# Patient Record
Sex: Female | Born: 1965 | Race: White | Hispanic: No | Marital: Married | Smoking: Never smoker
Health system: Southern US, Community
[De-identification: ages and names within clinical notes are randomized; demographics above are authoritative.]

## PROBLEM LIST (undated history)

## (undated) DIAGNOSIS — M722 Plantar fascial fibromatosis: Secondary | ICD-10-CM

## (undated) HISTORY — PX: FASCIOTOMY FOOT / TOE: SUR603

## (undated) HISTORY — DX: Plantar fascial fibromatosis: M72.2

---

## 2014-09-13 ENCOUNTER — Ambulatory Visit (INDEPENDENT_AMBULATORY_CARE_PROVIDER_SITE_OTHER): Payer: 59

## 2014-09-13 ENCOUNTER — Ambulatory Visit (INDEPENDENT_AMBULATORY_CARE_PROVIDER_SITE_OTHER): Payer: 59 | Admitting: Podiatry

## 2014-09-13 DIAGNOSIS — R52 Pain, unspecified: Secondary | ICD-10-CM

## 2014-09-13 DIAGNOSIS — M722 Plantar fascial fibromatosis: Secondary | ICD-10-CM

## 2014-09-13 NOTE — Progress Notes (Signed)
   Subjective:    Patient ID: Rachael BatonSharon H Fischer, female    DOB: 1965/11/08, 49 y.o.   MRN: [id number]  HPI  PT STATED LT FOOT ARCH, HEEL, AND BACK OF THE HEEL IS BEEN HURTING FOR 1 YEAR.  THE HEEL IS BEEN SAME BUT PAINFUL AND GET WORSE WHEN GET UP IN THE MORNING. DR. ?  TRIED 3 CORTISONE, ANKLE WARP, TAPING, AND INSERTS BUT NO HELP.  Review of Systems  All other systems reviewed and are negative.      Objective:   Physical Exam: I have reviewed her past medical history medications allergies surgery social history and review of systems. 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 Pulses are strongly palpable bilateral. Neurologic sensorium is intact per Semmes-Weinstein monofilament. Deep tendon reflexes are intact bilateral and muscle strength is 5 over 5 dorsiflexion plantar flexors inverters and everters all intrinsic musculature is intact. Orthopedic evaluation inserts all joints distal to the ankle for range of motion without crepitation. There she does have pain on direct palpation medial tibial tubercle of the left heel. Radiographs do demonstrate what appears to be a chronic inflammatory event at the plantar fascial calcaneal insertion site. He also has a relatively large retrocalcaneal heel spur with no increase in soft tissue density around the calcaneus and Ocean site of the Achilles.        Assessment & Plan:  Assessment: Chronic intractable plantar fasciitis of the left foot. We discussed the pros and cons of surgery we also discussed alternatives to surgery including extracorporal shock wave therapy physical therapy and sclerosing injections.  We went over a consent form today line by line number by number giving her ample time to ask questions she saw fit regarding an endoscopic plantar fasciotomy of the left foot. I answered all questions regarding this procedure to the best of my ability in layman's terms. We did discuss the possible postoperative  complications which may include but are not limited to postop pain bleeding swelling infection recurrence and need for further surgery. We also discussed the possible rupture of the plantar fascia as the lateral cuboid impingement syndrome. She understands this and is amenable to it signed Dr. pages of the consent form. She was dispensed a cam walker for postoperative ambulation and upon the next visit she will need to be dispensed a night splint as well. I will follow-up with her in the near future for surgery.

## 2014-10-18 ENCOUNTER — Other Ambulatory Visit: Payer: Self-pay | Admitting: Podiatry

## 2014-10-18 MED ORDER — PROMETHAZINE HCL 25 MG PO TABS: 25.0000 mg | ORAL_TABLET | Freq: Three times a day (TID) | ORAL | Status: DC | PRN

## 2014-10-18 MED ORDER — OXYCODONE-ACETAMINOPHEN 10-325 MG PO TABS: ORAL_TABLET | ORAL | Status: DC

## 2014-10-18 MED ORDER — CEPHALEXIN 500 MG PO CAPS: 500.0000 mg | ORAL_CAPSULE | Freq: Three times a day (TID) | ORAL | Status: DC

## 2014-10-20 ENCOUNTER — Encounter: Payer: Self-pay | Admitting: Podiatry

## 2014-10-20 DIAGNOSIS — M722 Plantar fascial fibromatosis: Secondary | ICD-10-CM | POA: Diagnosis not present

## 2014-10-25 ENCOUNTER — Ambulatory Visit (INDEPENDENT_AMBULATORY_CARE_PROVIDER_SITE_OTHER): Payer: 59 | Admitting: Podiatry

## 2014-10-25 VITALS — BP 148/90 | HR 111 | Temp 97.8°F | Resp 16

## 2014-10-25 DIAGNOSIS — Z9889 Other specified postprocedural states: Secondary | ICD-10-CM

## 2014-10-25 NOTE — Progress Notes (Signed)
She presents today 1 week status post EPF left foot. He states this seems to be doing well.  Objective: Vital signs are stable she is alert and oriented 3 dressing intact once removed demonstrates no erythema edema cellulitis drainage or odor sutures are intact and margins are well coapted.  Assessment: Well-healed surgical foot status post 1 week EPF left.  Plan: Redressed with light dressing today will allow her to start washing her foot and soaking in Epsom salts and warm water. She will continue the use of the Cam Walker at all times. She will follow up with Dr. Ardelle AntonWagoner in 1 week for suture removal. At that time she may go to a tennis shoe but she must sleep in the night splint or the Cam Walker for one month. I will follow-up with her 2 weeks after that.

## 2014-10-25 NOTE — Progress Notes (Signed)
DOS 10/20/2014 Endoscopic Plantar Fasciotomy left foot. 

## 2014-11-02 ENCOUNTER — Ambulatory Visit (INDEPENDENT_AMBULATORY_CARE_PROVIDER_SITE_OTHER): Payer: 59 | Admitting: Podiatry

## 2014-11-02 ENCOUNTER — Encounter: Payer: Self-pay | Admitting: Podiatry

## 2014-11-02 VITALS — BP 124/79 | HR 91 | Resp 16

## 2014-11-02 DIAGNOSIS — Z9889 Other specified postprocedural states: Secondary | ICD-10-CM | POA: Diagnosis not present

## 2014-11-02 DIAGNOSIS — M722 Plantar fascial fibromatosis: Secondary | ICD-10-CM

## 2014-11-02 NOTE — Patient Instructions (Signed)
Wear CAM walker or night splint at night.

## 2014-11-03 NOTE — Progress Notes (Signed)
Patient ID: Shon BatonSharon H Werber, female   DOB: 02/07/1966, 49 y.o.   MRN: [id number]  Subjective: 49 year old female returns the office 2 weeks status post left EPF. She states that she is doing well without any pain or any complaints. She's been soaking her feet in Epson salt soaks and continue with the Cam Walker. Denies any systemic complaints as fevers, chills, nausea, vomiting. Denies any calf pain, chest pain, shortness of breath.  Objective: AAO 3, NAD DP/PT pulses palpable, CRT less than 3 seconds Protective sensation appears to be intact Incision along the medial aspect of the left heel along the surgical site is well coapted without any evidence of dehiscence and sutures intact. There is no surrounding erythema, drainage, or any other clinical signs of infection. There is no tenderness to palpation around the surgical site is no tenderness overlying the plantar aspect of the calcaneus on the plantar fascia. There is no overlying edema, erythema, increase in warmth. No other areas of tenderness bilateral lower extremities. No pain with calf compression, swelling, warmth, erythema No other open lesions or pre-ulcerative lesions identified  Assessment: 49 year old female 2 weeks status post EPF left  Plan: -Sutures removed without complications. Interbody ointment and a Band-Aid was applied. -At this time she consented to transition back into a regular tennis shoe as tolerated however she must sleep the night splint or the Cam Walker. Night splint was dispensed. -Monitor for any signs or symptoms of infection and directed to call the office immediately should any occur. -Follow-up in 4 weeks or sooner if any problems are to arise. In the meantime encouraged to call the office with any questions, concerns, changes symptoms.

## 2014-11-14 ENCOUNTER — Ambulatory Visit (INDEPENDENT_AMBULATORY_CARE_PROVIDER_SITE_OTHER): Payer: 59 | Admitting: Podiatry

## 2014-11-14 ENCOUNTER — Encounter: Payer: Self-pay | Admitting: Podiatry

## 2014-11-14 VITALS — BP 150/95 | HR 87 | Resp 16

## 2014-11-14 DIAGNOSIS — Z9889 Other specified postprocedural states: Secondary | ICD-10-CM

## 2014-11-14 DIAGNOSIS — M722 Plantar fascial fibromatosis: Secondary | ICD-10-CM

## 2014-11-14 NOTE — Progress Notes (Signed)
She presents today 3 weeks status post endoscopic plantar fasciotomy for left heel. She states that the left heel is still sore not painful like it was a considerably sore. 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  Objective: Vital signs are stable she is alert and oriented 3. No erythema edema cellulitis drainage or odor. Nonpalpable medial fascial band. She does have palpable scar tissue from the surgical site which is tender on palpation.  Assessment: Resolving endoscopic plantar fasciotomy pain right foot.  Plan: Continue massage therapy and continue use of the tennis shoes with orthotics and the night splint at bedtime I will follow-up with her in 2-3 weeks.

## 2014-11-27 ENCOUNTER — Encounter: Payer: Self-pay | Admitting: Podiatry

## 2014-11-27 ENCOUNTER — Ambulatory Visit (INDEPENDENT_AMBULATORY_CARE_PROVIDER_SITE_OTHER): Payer: 59 | Admitting: Podiatry

## 2014-11-27 VITALS — BP 150/82 | HR 81 | Resp 16

## 2014-11-27 DIAGNOSIS — Z9889 Other specified postprocedural states: Secondary | ICD-10-CM

## 2014-11-27 DIAGNOSIS — M722 Plantar fascial fibromatosis: Secondary | ICD-10-CM

## 2014-11-27 NOTE — Progress Notes (Signed)
She presents today one month status post endoscopic plantar fasciotomy of her left foot. She states this seems to be getting better all the time. She continues to massage the scar tissue daily.  Objective: Vital signs are stable she's alert and oriented 3 minus less pain on palpation medial calcaneal tubercle of the left heel today. The hardware area of scar tissue appears to be much more supple at this point and appears to be healing and resolving.  Assessment: Well-healing endoscopic plantar fasciotomy left heel.  Plan: Follow up with me in 1 month continue all conservative therapies.

## 2014-11-29 ENCOUNTER — Encounter: Payer: 59 | Admitting: Podiatry

## 2014-12-25 ENCOUNTER — Ambulatory Visit (INDEPENDENT_AMBULATORY_CARE_PROVIDER_SITE_OTHER): Payer: 59 | Admitting: Podiatry

## 2014-12-25 ENCOUNTER — Encounter: Payer: Self-pay | Admitting: Podiatry

## 2014-12-25 VITALS — BP 143/81 | HR 80 | Resp 16

## 2014-12-25 DIAGNOSIS — Z9889 Other specified postprocedural states: Secondary | ICD-10-CM | POA: Diagnosis not present

## 2014-12-25 DIAGNOSIS — M722 Plantar fascial fibromatosis: Secondary | ICD-10-CM

## 2014-12-25 NOTE — Progress Notes (Signed)
She presents today for follow-up of her plantar fasciotomy. She states that she still has tenderness to her left heel but not like before. She states that this seems to be more postsurgical soreness than an actual fasciitis.  Objective: Vital signs are stable alert and oriented 3. Pulses are intact. She has pain on palpation with a palpable deep scar left heel.  Assessment: Status post endoscopic plantar fasciotomy with scar tissue formation left foot.  Plan: Injected today with Kenalog and local anesthetic. Encouraged activity and massage therapy.

## 2015-02-07 ENCOUNTER — Ambulatory Visit: Payer: 59 | Admitting: Podiatry

## 2015-04-04 ENCOUNTER — Ambulatory Visit (INDEPENDENT_AMBULATORY_CARE_PROVIDER_SITE_OTHER): Payer: 59 | Admitting: Podiatry

## 2015-04-04 ENCOUNTER — Ambulatory Visit (INDEPENDENT_AMBULATORY_CARE_PROVIDER_SITE_OTHER): Payer: 59

## 2015-04-04 ENCOUNTER — Encounter: Payer: Self-pay | Admitting: Podiatry

## 2015-04-04 VITALS — BP 139/83 | HR 85 | Resp 18

## 2015-04-04 DIAGNOSIS — R52 Pain, unspecified: Secondary | ICD-10-CM | POA: Diagnosis not present

## 2015-04-04 DIAGNOSIS — M722 Plantar fascial fibromatosis: Secondary | ICD-10-CM

## 2015-04-04 DIAGNOSIS — M7670 Peroneal tendinitis, unspecified leg: Secondary | ICD-10-CM

## 2015-04-04 DIAGNOSIS — M7752 Other enthesopathy of left foot: Secondary | ICD-10-CM | POA: Diagnosis not present

## 2015-04-04 DIAGNOSIS — M778 Other enthesopathies, not elsewhere classified: Secondary | ICD-10-CM

## 2015-04-04 DIAGNOSIS — M7672 Peroneal tendinitis, left leg: Secondary | ICD-10-CM

## 2015-04-04 DIAGNOSIS — M779 Enthesopathy, unspecified: Secondary | ICD-10-CM

## 2015-04-04 MED ORDER — METHYLPREDNISOLONE 4 MG PO TBPK: ORAL_TABLET | ORAL | Status: DC

## 2015-04-04 NOTE — Progress Notes (Signed)
She presents today many months status post EPF left performed March 2016. She states that she's tried to start running recently and has developed some mid arch pain and some pain to the lateral aspect of her foot. She denies any significant trauma. Each new shoes and wears orthotics that her were made by Dr. Orland Jarred 1 year ago.  Objective: Vital signs are stable alert and oriented 3 no acute distress. Pulses are strongly palpable neurologic sensorium is intact for Semmes-Weinstein monofilament. Deep tendon reflexes are intact bilateral and muscle strength is 5 over 5 dorsiflexion plantar flexors and inverters everters on physical musculatures intact. Orthopedic evaluation demonstrates mild tenderness on palpation medial longitudinal arch of the left foot as well as palpation of the peroneal longus tendon left. Radiographs taken in the office today demonstrate no major osseous abnormalities that she does demonstrate some soft tissue swelling along an os perineum of the left foot.  Assessment: Plantar fasciitis peroneal tendinitis left foot.  Plan: I encouraged her to continue to wear her orthotics with her tennis shoes and I suggested that she start icing the foot after exercise as well as started on a Medrol Dosepak.

## 2015-05-24 ENCOUNTER — Encounter: Payer: Self-pay | Admitting: Physician Assistant

## 2015-05-24 ENCOUNTER — Ambulatory Visit: Payer: Self-pay | Admitting: Family

## 2015-05-24 VITALS — BP 121/64 | Temp 98.6°F

## 2015-05-24 DIAGNOSIS — R05 Cough: Secondary | ICD-10-CM

## 2015-05-24 DIAGNOSIS — R059 Cough, unspecified: Secondary | ICD-10-CM

## 2015-05-24 DIAGNOSIS — J069 Acute upper respiratory infection, unspecified: Secondary | ICD-10-CM

## 2015-05-24 MED ORDER — AZITHROMYCIN 250 MG PO TABS: ORAL_TABLET | ORAL | Status: DC

## 2015-05-24 MED ORDER — HYDROCOD POLST-CPM POLST ER 10-8 MG/5ML PO SUER: 5.0000 mL | Freq: Two times a day (BID) | ORAL | Status: DC | PRN

## 2015-05-24 NOTE — Progress Notes (Signed)
S. Cough , cold sxs x one week starting to get facial pain and pressure, malaise. Cough worse at night and can not sleep  O/ alert pleasant NAD , VSS  ENT + turbinates boggy inflamed, + facial guarding, pharynx injected  Neck supple heart rsr lungs clear A/ URI  Cough  P Zpack 250 . rx  Written for Tussionex  1 tsp q12 h prn #100 cc orf  supportive measures.

## 2015-10-10 ENCOUNTER — Ambulatory Visit (INDEPENDENT_AMBULATORY_CARE_PROVIDER_SITE_OTHER): Payer: 59 | Admitting: Podiatry

## 2015-10-10 ENCOUNTER — Ambulatory Visit: Payer: Self-pay

## 2015-10-10 ENCOUNTER — Encounter: Payer: Self-pay | Admitting: Podiatry

## 2015-10-10 ENCOUNTER — Ambulatory Visit (INDEPENDENT_AMBULATORY_CARE_PROVIDER_SITE_OTHER): Payer: 59

## 2015-10-10 VITALS — BP 125/88 | HR 111 | Resp 16

## 2015-10-10 DIAGNOSIS — M722 Plantar fascial fibromatosis: Secondary | ICD-10-CM | POA: Diagnosis not present

## 2015-10-10 DIAGNOSIS — M7672 Peroneal tendinitis, left leg: Secondary | ICD-10-CM

## 2015-10-10 DIAGNOSIS — M7670 Peroneal tendinitis, unspecified leg: Secondary | ICD-10-CM

## 2015-10-10 MED ORDER — METHYLPREDNISOLONE 4 MG PO TBPK: ORAL_TABLET | ORAL | Status: DC

## 2015-10-10 NOTE — Patient Instructions (Addendum)
Plantar Fasciitis (Heel Spur Syndrome) with Rehab The plantar fascia is a fibrous, ligament-like, soft-tissue structure that spans the bottom of the foot. Plantar fasciitis is a condition that causes pain in the foot due to inflammation of the tissue. SYMPTOMS   Pain and tenderness on the underneath side of the foot.  Pain that worsens with standing or walking. CAUSES  Plantar fasciitis is caused by irritation and injury to the plantar fascia on the underneath side of the foot. Common mechanisms of injury include:  Direct trauma to bottom of the foot.  Damage to a small nerve that runs under the foot where the main fascia attaches to the heel bone.  Stress placed on the plantar fascia due to bone spurs. RISK INCREASES WITH:   Activities that place stress on the plantar fascia (running, jumping, pivoting, or cutting).  Poor strength and flexibility.  Improperly fitted shoes.  Tight calf muscles.  Flat feet.  Failure to warm-up properly before activity.  Obesity. PREVENTION  Warm up and stretch properly before activity.  Allow for adequate recovery between workouts.  Maintain physical fitness:  Strength, flexibility, and endurance.  Cardiovascular fitness.  Maintain a health body weight.  Avoid stress on the plantar fascia.  Wear properly fitted shoes, including arch supports for individuals who have flat feet.  PROGNOSIS  If treated properly, then the symptoms of plantar fasciitis usually resolve without surgery. However, occasionally surgery is necessary.  RELATED COMPLICATIONS   Recurrent symptoms that may result in a chronic condition.  Problems of the lower back that are caused by compensating for the injury, such as limping.  Pain or weakness of the foot during push-off following surgery.  Chronic inflammation, scarring, and partial or complete fascia tear, occurring more often from repeated injections.  TREATMENT  Treatment initially involves the  use of ice and medication to help reduce pain and inflammation. The use of strengthening and stretching exercises may help reduce pain with activity, especially stretches of the Achilles tendon. These exercises may be performed at home or with a therapist. Your caregiver may recommend that you use heel cups of arch supports to help reduce stress on the plantar fascia. Occasionally, corticosteroid injections are given to reduce inflammation. If symptoms persist for greater than 6 months despite non-surgical (conservative), then surgery may be recommended.   MEDICATION   If pain medication is necessary, then nonsteroidal anti-inflammatory medications, such as aspirin and ibuprofen, or other minor pain relievers, such as acetaminophen, are often recommended.  Do not take pain medication within 7 days before surgery.  Prescription pain relievers may be given if deemed necessary by your caregiver. Use only as directed and only as much as you need.  Corticosteroid injections may be given by your caregiver. These injections should be reserved for the most serious cases, because they may only be given a certain number of times.  HEAT AND COLD  Cold treatment (icing) relieves pain and reduces inflammation. Cold treatment should be applied for 10 to 15 minutes every 2 to 3 hours for inflammation and pain and immediately after any activity that aggravates your symptoms. Use ice packs or massage the area with a piece of ice (ice massage).  Heat treatment may be used prior to performing the stretching and strengthening activities prescribed by your caregiver, physical therapist, or athletic trainer. Use a heat pack or soak the injury in warm water.  SEEK IMMEDIATE MEDICAL CARE IF:  Treatment seems to offer no benefit, or the condition worsens.  Any medications   produce adverse side effects.  EXERCISES- RANGE OF MOTION (ROM) AND STRETCHING EXERCISES - Plantar Fasciitis (Heel Spur Syndrome) These exercises  may help you when beginning to rehabilitate your injury. Your symptoms may resolve with or without further involvement from your physician, physical therapist or athletic trainer. While completing these exercises, remember:   Restoring tissue flexibility helps normal motion to return to the joints. This allows healthier, less painful movement and activity.  An effective stretch should be held for at least 30 seconds.  A stretch should never be painful. You should only feel a gentle lengthening or release in the stretched tissue.  RANGE OF MOTION - Toe Extension, Flexion  Sit with your right / left leg crossed over your opposite knee.  Grasp your toes and gently pull them back toward the top of your foot. You should feel a stretch on the bottom of your toes and/or foot.  Hold this stretch for 10 seconds.  Now, gently pull your toes toward the bottom of your foot. You should feel a stretch on the top of your toes and or foot.  Hold this stretch for 10 seconds. Repeat  times. Complete this stretch 3 times per day.   RANGE OF MOTION - Ankle Dorsiflexion, Active Assisted  Remove shoes and sit on a chair that is preferably not on a carpeted surface.  Place right / left foot under knee. Extend your opposite leg for support.  Keeping your heel down, slide your right / left foot back toward the chair until you feel a stretch at your ankle or calf. If you do not feel a stretch, slide your bottom forward to the edge of the chair, while still keeping your heel down.  Hold this stretch for 10 seconds. Repeat 3 times. Complete this stretch 2 times per day.   STRETCH  Gastroc, Standing  Place hands on wall.  Extend right / left leg, keeping the front knee somewhat bent.  Slightly point your toes inward on your back foot.  Keeping your right / left heel on the floor and your knee straight, shift your weight toward the wall, not allowing your back to arch.  You should feel a gentle stretch  in the right / left calf. Hold this position for 10 seconds. Repeat 3 times. Complete this stretch 2 times per day.  STRETCH  Soleus, Standing  Place hands on wall.  Extend right / left leg, keeping the other knee somewhat bent.  Slightly point your toes inward on your back foot.  Keep your right / left heel on the floor, bend your back knee, and slightly shift your weight over the back leg so that you feel a gentle stretch deep in your back calf.  Hold this position for 10 seconds. Repeat 3 times. Complete this stretch 2 times per day.  STRETCH  Gastrocsoleus, Standing  Note: This exercise can place a lot of stress on your foot and ankle. Please complete this exercise only if specifically instructed by your caregiver.   Place the ball of your right / left foot on a step, keeping your other foot firmly on the same step.  Hold on to the wall or a rail for balance.  Slowly lift your other foot, allowing your body weight to press your heel down over the edge of the step.  You should feel a stretch in your right / left calf.  Hold this position for 10 seconds.  Repeat this exercise with a slight bend in your right /   left knee. Repeat 3 times. Complete this stretch 2 times per day.   STRENGTHENING EXERCISES - Plantar Fasciitis (Heel Spur Syndrome)  These exercises may help you when beginning to rehabilitate your injury. They may resolve your symptoms with or without further involvement from your physician, physical therapist or athletic trainer. While completing these exercises, remember:   Muscles can gain both the endurance and the strength needed for everyday activities through controlled exercises.  Complete these exercises as instructed by your physician, physical therapist or athletic trainer. Progress the resistance and repetitions only as guided.  STRENGTH - Towel Curls  Sit in a chair positioned on a non-carpeted surface.  Place your foot on a towel, keeping your heel  on the floor.  Pull the towel toward your heel by only curling your toes. Keep your heel on the floor. Repeat 3 times. Complete this exercise 2 times per day.  STRENGTH - Ankle Inversion  Secure one end of a rubber exercise band/tubing to a fixed object (table, pole). Loop the other end around your foot just before your toes.  Place your fists between your knees. This will focus your strengthening at your ankle.  Slowly, pull your big toe up and in, making sure the band/tubing is positioned to resist the entire motion.  Hold this position for 10 seconds.  Have your muscles resist the band/tubing as it slowly pulls your foot back to the starting position. Repeat 3 times. Complete this exercises 2 times per day.  Document Released: 07/21/2005 Document Revised: 10/13/2011 Document Reviewed: 11/02/2008 [facility] IncExitCare Patient Information 2014 [place], [place]. Peroneal Tendinitis With Rehab Tendonitis is inflammation of a tendon. Inflammation of the tendons on the back of the outer ankle (peroneal tendons) is known as peroneal tendonitis. The peroneal tendons are responsible for connecting the muscles that allow you to stand on your tiptoes to the bones of the ankle. For this reason, peroneal tendonitis often causes pain when trying to complete such motions. Peroneal tendonitis often involves a tear (strain) of the peroneal tendons. Strains are classified into three categories. Grade 1 strains cause pain, but the tendon is not lengthened. Grade 2 strains include a lengthened ligament, due to the ligament being stretched or partially ruptured. With grade 2 strains there is still function, although function may be decreased. Grade 3 strains involve a complete tear of the tendon or muscle, and function is usually impaired. SYMPTOMS   Pain, tenderness, swelling, warmth, or redness over the back of the outer side of the ankle, the outer part of the mid-foot, or the bottom of the arch.  Pain that gets worse  with ankle motion (especially when pushing off or pushing down with the front of the foot), or when standing on the ball of the foot or pushing the foot outward.  Crackling sound (crepitation) when the tendon is moved or touched. CAUSES  Peroneal tendinitis occurs when injury to the peroneal tendons causes the body to respond with inflammation. Common causes of injury include:  An overuse injury, in which the groove behind the outer ankle (where the tendon is located) causes wear on the tendon.  A sudden stress placed on the tendon, such as from an increase in the intensity, frequency, or duration of training.  Direct hit (trauma) to the tendon.  Return to activity too soon after a previous ankle injury. RISK INCREASES WITH:  Sports that require sudden, repetitive pushing off of the foot, such as jumping or quick starts.  Kicking and running sports, especially running down  hills or long distances.  Poor strength and flexibility.  Previous injury to the foot, ankle, or leg. PREVENTION  Warm up and stretch properly before activity.  Allow for adequate recovery between workouts.  Maintain physical fitness:  Strength, flexibility, and endurance.  Cardiovascular fitness.  Complete rehabilitation after previous injury. PROGNOSIS  If treated properly, peroneal tendonitis usually heals within 6 weeks.  RELATED COMPLICATIONS  Longer healing time, if not properly treated or if not given enough time to heal.  Recurring symptoms if activity is resumed too soon, with overuse, or when using poor technique.  If untreated, tendinitis may result in tendon rupture, requiring surgery. TREATMENT  Treatment first involves the use of ice and medicine to reduce pain and inflammation. The use of strengthening and stretching exercises may help reduce pain with activity. These exercises may be performed at unsuccessful, surgery to remove the inflamed tendon lining (sheath) may be advised.    MEDICATION   If pain medicine is needed, nonsteroidal anti-inflammatory medicines (aspirin and ibuprofen), or other minor pain relievers (acetaminophen), are often advised.  Do not take pain medicine for 7 days before surgery.  Prescription pain relievers may be given, if your caregiver thinks they are needed. Use only as directed and only as much as you need. HEAT AND COLD  Cold treatment (icing) should be applied for 10 to 15 minutes every 2 to 3 hours for inflammation and pain, and immediately after activity that aggravates your symptoms. Use ice packs or an ice massage.  Heat treatment may be used before performing stretching and strengthening activities prescribed by your caregiver, physical therapist, or athletic trainer. Use a heat pack or a warm water soak. SEEK MEDICAL CARE IF:  Symptoms get worse or do not improve in 2 to 4 weeks, despite treatment.  New, unexplained symptoms develop. (Drugs used in treatment may produce side effects.) EXERCISES RANGE OF MOTION (ROM) AND STRETCHING EXERCISES - Peroneal Tendinitis These exercises may help you when beginning to rehabilitate your injury. Your symptoms may resolve with or without further involvement from your physician, physical therapist or athletic trainer. While completing these exercises, remember:   Restoring tissue flexibility helps normal motion to return to the joints. This allows healthier, less painful movement and activity.  An effective stretch should be held for at least 30 seconds.  A stretch should never be painful. You should only feel a gentle lengthening or release in the stretched tissue. RANGE OF MOTION - Ankle Eversion  Sit with your right / left ankle crossed over your opposite knee.  Grip your foot with your opposite hand, placing your thumb on the top of your foot and your fingers across the bottom of your foot.  Gently push your foot downward with a slight rotation, so your littlest toes rise slightly  toward the ceiling.  You should feel a gentle stretch on the inside of your ankle. Hold the stretch for __________ seconds. Repeat __________ times. Complete this exercise __________ times per day.  RANGE OF MOTION - Ankle Inversion  Sit with your right / left ankle crossed over your opposite knee.  Grip your foot with your opposite hand, placing your thumb on the bottom of your foot and your fingers across the top of your foot.  Gently pull your foot so the smallest toe comes toward you and your thumb pushes the inside of the ball of your foot away from you.  You should feel a gentle stretch on the outside of your ankle. Hold the stretch  for __________ seconds. Repeat __________ times. Complete this exercise __________ times per day.  RANGE OF MOTION - Ankle Plantar Flexion  Sit with your right / left leg crossed over your opposite knee.  Use your opposite hand to pull the top of your foot and toes toward you.  You should feel a gentle stretch on the top of your foot and ankle. Hold this position for __________ seconds. Repeat __________ times. Complete __________ times per day.  STRETCH - Gastroc, Standing  Place your hands on a wall.  Extend your right / left leg behind you, keeping the front knee somewhat bent.  Slightly point your toes inward on your back foot.  Keeping your right / left heel on the floor and your knee straight, shift your weight toward the wall, not allowing your back to arch.  You should feel a gentle stretch in the calf. Hold this position for __________ seconds. Repeat __________ times. Complete this stretch __________ times per day. STRETCH - Soleus, Standing  Place your hands on a wall.  Extend your right / left leg behind you, keeping the other knee somewhat bent.  Slightly point your toes inward on your back foot.  Keep your heel on the floor, bend your back knee, and slightly shift your weight over the back leg so that you feel a gentle stretch  deep in your back calf.  Hold this position for __________ seconds. Repeat __________ times. Complete this stretch __________ times per day. STRETCH - Gastrocsoleus, Standing Note: This exercise can place a lot of stress on your foot and ankle. Please complete this exercise only if specifically instructed by your caregiver.   Place the ball of your right / left foot on a step, keeping your other foot firmly on the same step.  Hold on to the wall or a rail for balance.  Slowly lift your other foot, allowing your body weight to press your heel down over the edge of the step.  You should feel a stretch in your right / left calf.  Hold this position for __________ seconds.  Repeat this exercise with a slight bend in your knee. Repeat __________ times. Complete this stretch __________ times per day.  STRENGTHENING EXERCISES - Peroneal Tendinitis  These exercises may help you when beginning to rehabilitate your injury. They may resolve your symptoms with or without further involvement from your physician, physical therapist or athletic trainer. While completing these exercises, remember:   Muscles can gain both the endurance and the strength needed for everyday activities through controlled exercises.  Complete these exercises as instructed by your physician, physical therapist or athletic trainer. Increase the resistance and repetitions only as guided by your caregiver. STRENGTH - Dorsiflexors  Secure a rubber exercise band or tubing to a fixed object (table, pole) and loop the other end around your right / left foot.  Sit on the floor facing the fixed object. The band should be slightly tense when your foot is relaxed.  Slowly draw your foot back toward you, using your ankle and toes.  Hold this position for __________ seconds. Slowly release the tension in the band and return your foot to the starting position. Repeat __________ times. Complete this exercise __________ times per day.    STRENGTH - Towel Curls  Sit in a chair, on a non-carpeted surface.  Place your foot on a towel, keeping your heel on the floor.  Pull the towel toward your heel only by curling your toes. Keep your heel on  the floor.  If instructed by your physician, physical therapist or athletic trainer, add weight to the end of the towel. Repeat __________ times. Complete this exercise __________ times per day. STRENGTH - Ankle Eversion   Secure one end of a rubber exercise band or tubing to a fixed object (table, pole). Loop the other end around your foot, just before your toes.  Place your fists between your knees. This will focus your strengthening at your ankle.  Drawing the band across your opposite foot, away from the pole, slowly, pull your little toe out and up. Make sure the band is positioned to resist the entire motion.  Hold this position for __________ seconds.  Have your muscles resist the band, as it slowly pulls your foot back to the starting position. Repeat __________ times. Complete this exercise __________ times per day.    This information is not intended to replace advice given to you by your health care provider. Make sure you discuss any questions you have with your health care provider.   Document Released: 07/21/2005 Document Revised: 12/05/2014 Document Reviewed: 11/02/2008 [employer] Interactive Patient Education [employer]

## 2015-10-10 NOTE — Progress Notes (Signed)
She presents today with chief complaint of a new plantar fascial pain to the right foot. She still has some pain to the dorsolateral aspect of her left foot.  Objective: Vital signs are stable she is alert and oriented 3. Pulses are palpable. She is pain on palpation medial tubercle of the right heel today radiographs taken today do demonstrate soft tissue increase in density plantar fascial tendon insertion site of the right foot. Left foot demonstrates no major osseous abnormalities.  Assessment: Plantar fascitis right. Compensatory tendinitis. No tendinitis left foot.  Plan: I injected between the fourth and fifth metatarsals of the left foot today and I also injected the right heel today both with Kenalog and local anesthetic fascial brace applied to the right foot. Discussed appropriate shoes to excise and ice therapy. Started her on Medrol Dosepak. Follow up with her in 1 month

## 2015-11-21 ENCOUNTER — Ambulatory Visit: Payer: 59 | Admitting: Podiatry

## 2016-02-25 ENCOUNTER — Ambulatory Visit: Payer: 59 | Admitting: Podiatry

## 2016-03-06 ENCOUNTER — Encounter: Payer: Self-pay | Admitting: Podiatry

## 2016-03-06 ENCOUNTER — Ambulatory Visit (INDEPENDENT_AMBULATORY_CARE_PROVIDER_SITE_OTHER): Payer: 59 | Admitting: Podiatry

## 2016-03-06 DIAGNOSIS — M7670 Peroneal tendinitis, unspecified leg: Secondary | ICD-10-CM | POA: Diagnosis not present

## 2016-03-06 DIAGNOSIS — M722 Plantar fascial fibromatosis: Secondary | ICD-10-CM | POA: Diagnosis not present

## 2016-03-06 DIAGNOSIS — M7672 Peroneal tendinitis, left leg: Secondary | ICD-10-CM

## 2016-03-07 NOTE — Progress Notes (Signed)
She presents today after having not seen her since March for follow-up of her plantar fasciitis of her right foot and peroneal tendinitis of the left foot. She states of the left foot is doing pretty well with the right foot is exquisitely painful.  Objective: Vital signs are stable she's alert and oriented 3. Pulses are palpable. She has no calf pain. She is severe pain on palpation medial cartilage over the right heel. She also has pain along the lateral aspect of the left foot. Pain on palpation to the lateral condyle and the fifth metatarsal base.  Assessment: Fasciitis right heel. Peroneal tendinitis and lateral plantar fasciitis left. The left foot has been surgically corrected.  Plan: I reinjected the area today and we discussed the possible need for surgical intervention. She states she would like to do this as soon as possible. I will follow-up with her in the near future for surgical consult regarding her right heel.

## 2016-03-27 ENCOUNTER — Ambulatory Visit: Payer: 59 | Admitting: Podiatry

## 2016-05-09 ENCOUNTER — Ambulatory Visit: Payer: Self-pay | Admitting: Physician Assistant

## 2016-05-09 ENCOUNTER — Encounter: Payer: Self-pay | Admitting: Physician Assistant

## 2016-05-09 VITALS — BP 159/80 | HR 100 | Temp 98.5°F

## 2016-05-09 DIAGNOSIS — J309 Allergic rhinitis, unspecified: Principal | ICD-10-CM

## 2016-05-09 DIAGNOSIS — H1010 Acute atopic conjunctivitis, unspecified eye: Secondary | ICD-10-CM

## 2016-05-09 MED ORDER — TOBRAMYCIN 0.3 % OP SOLN: 2.0000 [drp] | OPHTHALMIC | 0 refills | Status: DC

## 2016-05-09 MED ORDER — OLOPATADINE HCL 0.1 % OP SOLN: 1.0000 [drp] | Freq: Two times a day (BID) | OPHTHALMIC | 12 refills | Status: DC

## 2016-05-09 NOTE — Progress Notes (Signed)
S: c/o runny nose, congestion, itchy eyes, some sinus pressure, sx for about a week, denies fever/chills/body aches, cough, cp/sob, or v/d. Mucus is clear, using neti pot, worried that she has pink eye or scratched her eye with contact lens  O: vitals wnl, nad, perrl eomi, conjunctiva wnl, no abrasion noted, tms dull, nasal mucosa swollen and boggy, throat wnl, neck supple no lymph, lungs c t a, cv rrr  A: acute seasonal allergies, allergic conjunctivitis  P: saline nasal rinse, patanol opth gtts, tobramycin if redness worsening over weekend

## 2016-07-03 DIAGNOSIS — H5213 Myopia, bilateral: Secondary | ICD-10-CM | POA: Diagnosis not present

## 2016-08-20 ENCOUNTER — Ambulatory Visit: Payer: 59 | Admitting: Podiatry

## 2016-09-01 ENCOUNTER — Ambulatory Visit (INDEPENDENT_AMBULATORY_CARE_PROVIDER_SITE_OTHER): Payer: 59 | Admitting: Podiatry

## 2016-09-01 DIAGNOSIS — M722 Plantar fascial fibromatosis: Secondary | ICD-10-CM | POA: Diagnosis not present

## 2016-09-01 MED ORDER — ETODOLAC ER 400 MG PO TB24: 400.0000 mg | ORAL_TABLET | Freq: Every day | ORAL | 3 refills | Status: DC

## 2016-09-01 NOTE — Progress Notes (Signed)
She presents today for follow-up of her plantar fasciitis bilateral states that the heels been doing that for 6 months and over the past month or so started to ache a little bit. She refers to the right foot and to a lesser degree left foot.  Objective: Vital signs are stable she is alert and oriented 3. Pulses are palpable. She is pain on palpation medially continue tubercles bilateral right greater than left.  Assessment: Chronic intractable plantar fasciitis right greater than left. History syndrome left.  Plan: I injected the bilateral heels today with prolonged local anesthetic will follow up with her on an as-needed basis.

## 2016-09-04 ENCOUNTER — Encounter: Payer: Self-pay | Admitting: Family Medicine

## 2016-09-04 ENCOUNTER — Ambulatory Visit (INDEPENDENT_AMBULATORY_CARE_PROVIDER_SITE_OTHER): Payer: 59 | Admitting: Family Medicine

## 2016-09-04 VITALS — BP 134/84 | HR 84 | Temp 98.4°F | Ht 64.5 in | Wt 153.6 lb

## 2016-09-04 DIAGNOSIS — Z0000 Encounter for general adult medical examination without abnormal findings: Secondary | ICD-10-CM | POA: Insufficient documentation

## 2016-09-04 DIAGNOSIS — Z1231 Encounter for screening mammogram for malignant neoplasm of breast: Secondary | ICD-10-CM | POA: Diagnosis not present

## 2016-09-04 DIAGNOSIS — Z1239 Encounter for other screening for malignant neoplasm of breast: Secondary | ICD-10-CM

## 2016-09-04 DIAGNOSIS — Z1211 Encounter for screening for malignant neoplasm of colon: Secondary | ICD-10-CM

## 2016-09-04 DIAGNOSIS — Z719 Counseling, unspecified: Secondary | ICD-10-CM | POA: Insufficient documentation

## 2016-09-04 LAB — LIPID PANEL
Cholesterol: 187 mg/dL (ref 0–200)
HDL: 60.3 mg/dL
LDL Cholesterol: 115 mg/dL — ABNORMAL HIGH (ref 0–99)
NonHDL: 126.54
Total CHOL/HDL Ratio: 3
Triglycerides: 58 mg/dL (ref 0.0–149.0)
VLDL: 11.6 mg/dL (ref 0.0–40.0)

## 2016-09-04 LAB — CBC
HCT: 42.2 % (ref 36.0–46.0)
HEMOGLOBIN: 14.3 g/dL (ref 12.0–15.0)
MCHC: 34 g/dL (ref 30.0–36.0)
MCV: 90 fl (ref 78.0–100.0)
PLATELETS: 335 10*3/uL (ref 150.0–400.0)
RBC: 4.69 Mil/uL (ref 3.87–5.11)
RDW: 12.7 % (ref 11.5–15.5)
WBC: 8.3 10*3/uL (ref 4.0–10.5)

## 2016-09-04 LAB — COMPREHENSIVE METABOLIC PANEL WITH GFR
ALT: 17 U/L (ref 0–35)
AST: 22 U/L (ref 0–37)
Albumin: 4.9 g/dL (ref 3.5–5.2)
Alkaline Phosphatase: 87 U/L (ref 39–117)
BUN: 16 mg/dL (ref 6–23)
CO2: 32 meq/L (ref 19–32)
Calcium: 10.1 mg/dL (ref 8.4–10.5)
Chloride: 102 meq/L (ref 96–112)
Creatinine, Ser: 0.74 mg/dL (ref 0.40–1.20)
GFR: 87.42 mL/min
Glucose, Bld: 97 mg/dL (ref 70–99)
Potassium: 4 meq/L (ref 3.5–5.1)
Sodium: 139 meq/L (ref 135–145)
Total Bilirubin: 1 mg/dL (ref 0.2–1.2)
Total Protein: 8.2 g/dL (ref 6.0–8.3)

## 2016-09-04 LAB — HEMOGLOBIN A1C: Hgb A1c MFr Bld: 5.5 % (ref 4.6–6.5)

## 2016-09-04 NOTE — Assessment & Plan Note (Signed)
Patient going to GYN for Pap smear. Mammogram ordered. Patient to schedule. Referral to GI place for colonoscopy. Immunizations up-to-date. Labs today. BP slightly elevated today. 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 BP is persistently greater than 130/80, we will need to discuss treatment options.

## 2016-09-04 NOTE — Patient Instructions (Signed)
Keep an eye on your pressure.  If it is persistently greater than 130/80 let me know.  We will schedule your colonoscopy.  GYN recommendation - [facility] - Dr. Lynnette Caffey.  We will call with your lab results.  Take care  Follow up annually  Dr. Lacinda Axon  Health Maintenance, Female Introduction Adopting a healthy lifestyle and getting preventive care can go a long way to promote health and wellness. Talk with your health care provider about what schedule of regular examinations is right for you. This is a good chance for you to check in with your provider about disease prevention and staying healthy. In between checkups, there are plenty of things you can do on your own. Experts have done a lot of research about which lifestyle changes and preventive measures are most likely to keep you healthy. Ask your health care provider for more information. Weight and diet Eat a healthy diet  Be sure to include plenty of vegetables, fruits, low-fat dairy products, and lean protein.  Do not eat a lot of foods high in solid fats, added sugars, or salt.  Get regular exercise. This is one of the most important things you can do for your health.  Most adults should exercise for at least 150 minutes each week. The exercise should increase your heart rate and make you sweat (moderate-intensity exercise).  Most adults should also do strengthening exercises at least twice a week. This is in addition to the moderate-intensity exercise. Maintain a healthy weight  Body mass index (BMI) is a measurement that can be used to identify possible weight problems. It estimates body fat based on height and weight. Your health care provider can help determine your BMI and help you achieve or maintain a healthy weight.  For females 59 years of age and older:  A BMI below 18.5 is considered underweight.  A BMI of 18.5 to 24.9 is normal.  A BMI of 25 to 29.9 is considered overweight.  A BMI of 30 and above  is considered obese. Watch levels of cholesterol and blood lipids  You should start having your blood tested for lipids and cholesterol at 53 years of age, then have this test every 5 years.  You may need to have your cholesterol levels checked more often if:  Your lipid or cholesterol levels are high.  You are older than 53 years of age.  You are at high risk for heart disease. Cancer screening Lung Cancer  Lung cancer screening is recommended for adults 41-38 years old who are at high risk for lung cancer because of a history of smoking.  A yearly low-dose CT scan of the lungs is recommended for people who:  Currently smoke.  Have quit within the past 15 years.  Have at least a 30-pack-year history of smoking. A pack year is smoking an average of one pack of cigarettes a day for 1 year.  Yearly screening should continue until it has been 15 years since you quit.  Yearly screening should stop if you develop a health problem that would prevent you from having lung cancer treatment. Breast Cancer  Practice breast self-awareness. This means understanding how your breasts normally appear and feel.  It also means doing regular breast self-exams. Let your health care provider know about any changes, no matter how small.  If you are in your 20s or 30s, you should have a clinical breast exam (CBE) by a health care provider every 1-3 years as part of a regular  health exam.  If you are 40 or older, have a CBE every year. Also consider having a breast X-ray (mammogram) every year.  If you have a family history of breast cancer, talk to your health care provider about genetic screening.  If you are at high risk for breast cancer, talk to your health care provider about having an MRI and a mammogram every year.  Breast cancer gene (BRCA) assessment is recommended for women who have family members with BRCA-related cancers. BRCA-related cancers  include:  Breast.  Ovarian.  Tubal.  Peritoneal cancers.  Results of the assessment will determine the need for genetic counseling and BRCA1 and BRCA2 testing. Cervical Cancer  Your health care provider may recommend that you be screened regularly for cancer of the pelvic organs (ovaries, uterus, and vagina). This screening involves a pelvic examination, including checking for microscopic changes to the surface of your cervix (Pap test). You may be encouraged to have this screening done every 3 years, beginning at age 21.  For women ages 30-65, health care providers may recommend pelvic exams and Pap testing every 3 years, or they may recommend the Pap and pelvic exam, combined with testing for human papilloma virus (HPV), every 5 years. Some types of HPV increase your risk of cervical cancer. Testing for HPV may also be done on women of any age with unclear Pap test results.  Other health care providers may not recommend any screening for nonpregnant women who are considered low risk for pelvic cancer and who do not have symptoms. Ask your health care provider if a screening pelvic exam is right for you.  If you have had past treatment for cervical cancer or a condition that could lead to cancer, you need Pap tests and screening for cancer for at least 20 years after your treatment. If Pap tests have been discontinued, your risk factors (such as having a new sexual partner) need to be reassessed to determine if screening should resume. Some women have medical problems that increase the chance of getting cervical cancer. In these cases, your health care provider may recommend more frequent screening and Pap tests. Colorectal Cancer  This type of cancer can be detected and often prevented.  Routine colorectal cancer screening usually begins at 53 years of age and continues through 53 years of age.  Your health care provider may recommend screening at an earlier age if you have risk factors  for colon cancer.  Your health care provider may also recommend using home test kits to check for hidden blood in the stool.  A small camera at the end of a tube can be used to examine your colon directly (sigmoidoscopy or colonoscopy). This is done to check for the earliest forms of colorectal cancer.  Routine screening usually begins at age 50.  Direct examination of the colon should be repeated every 5-10 years through 53 years of age. However, you may need to be screened more often if early forms of precancerous polyps or small growths are found. Skin Cancer  Check your skin from head to toe regularly.  Tell your health care provider about any new moles or changes in moles, especially if there is a change in a mole's shape or color.  Also tell your health care provider if you have a mole that is larger than the size of a pencil eraser.  Always use sunscreen. Apply sunscreen liberally and repeatedly throughout the day.  Protect yourself by wearing long sleeves, pants, a wide-brimmed hat,   and sunglasses whenever you are outside. Heart disease, diabetes, and high blood pressure  High blood pressure causes heart disease and increases the risk of stroke. High blood pressure is more likely to develop in:  People who have blood pressure in the high end of the normal range (130-139/85-89 mm Hg).  People who are overweight or obese.  People who are African American.  If you are 79-48 years of age, have your blood pressure checked every 3-5 years. If you are 64 years of age or older, have your blood pressure checked every year. You should have your blood pressure measured twice-once when you are at a hospital or clinic, and once when you are not at a hospital or clinic. Record the average of the two measurements. To check your blood pressure when you are not at a hospital or clinic, you can use:  An automated blood pressure machine at a pharmacy.  A home blood pressure monitor.  If you  are between 66 years and 27 years old, ask your health care provider if you should take aspirin to prevent strokes.  Have regular diabetes screenings. This involves taking a blood sample to check your fasting blood sugar level.  If you are at a normal weight and have a low risk for diabetes, have this test once every three years after 53 years of age.  If you are overweight and have a high risk for diabetes, consider being tested at a younger age or more often. Preventing infection Hepatitis B  If you have a higher risk for hepatitis B, you should be screened for this virus. You are considered at high risk for hepatitis B if:  You were born in a country where hepatitis B is common. Ask your health care provider which countries are considered high risk.  Your parents were born in a high-risk country, and you have not been immunized against hepatitis B (hepatitis B vaccine).  You have HIV or AIDS.  You use needles to inject street drugs.  You live with someone who has hepatitis B.  You have had sex with someone who has hepatitis B.  You get hemodialysis treatment.  You take certain medicines for conditions, including cancer, organ transplantation, and autoimmune conditions. Hepatitis C  Blood testing is recommended for:  Everyone born from [birth year] through 1967.  Anyone with known risk factors for hepatitis C. Sexually transmitted infections (STIs)  You should be screened for sexually transmitted infections (STIs) including gonorrhea and chlamydia if:  You are sexually active and are younger than 53 years of age.  You are older than 53 years of age and your health care provider tells you that you are at risk for this type of infection.  Your sexual activity has changed since you were last screened and you are at an increased risk for chlamydia or gonorrhea. Ask your health care provider if you are at risk.  If you do not have HIV, but are at risk, it may be recommended that you  take a prescription medicine daily to prevent HIV infection. This is called pre-exposure prophylaxis (PrEP). You are considered at risk if:  You are sexually active and do not regularly use condoms or know the HIV status of your partner(s).  You take drugs by injection.  You are sexually active with a partner who has HIV. Talk with your health care provider about whether you are at high risk of being infected with HIV. If you choose to begin PrEP, you should first be  tested for HIV. You should then be tested every 3 months for as long as you are taking PrEP. Pregnancy  If you are premenopausal and you may become pregnant, ask your health care provider about preconception counseling.  If you may become pregnant, take 400 to 800 micrograms (mcg) of folic acid every day.  If you want to prevent pregnancy, talk to your health care provider about birth control (contraception). Osteoporosis and menopause  Osteoporosis is a disease in which the bones lose minerals and strength with aging. This can result in serious bone fractures. Your risk for osteoporosis can be identified using a bone density scan.  If you are 65 years of age or older, or if you are at risk for osteoporosis and fractures, ask your health care provider if you should be screened.  Ask your health care provider whether you should take a calcium or vitamin D supplement to lower your risk for osteoporosis.  Menopause may have certain physical symptoms and risks.  Hormone replacement therapy may reduce some of these symptoms and risks. Talk to your health care provider about whether hormone replacement therapy is right for you. Follow these instructions at home:  Schedule regular health, dental, and eye exams.  Stay current with your immunizations.  Do not use any tobacco products including cigarettes, chewing tobacco, or electronic cigarettes.  If you are pregnant, do not drink alcohol.  If you are breastfeeding, limit  how much and how often you drink alcohol.  Limit alcohol intake to no more than 1 drink per day for nonpregnant women. One drink equals 12 ounces of beer, 5 ounces of wine, or 1 ounces of hard liquor.  Do not use street drugs.  Do not share needles.  Ask your health care provider for help if you need support or information about quitting drugs.  Tell your health care provider if you often feel depressed.  Tell your health care provider if you have ever been abused or do not feel safe at home. This information is not intended to replace advice given to you by your health care provider. Make sure you discuss any questions you have with your health care provider. Document Released: 02/03/2011 Document Revised: 12/27/2015 Document Reviewed: 04/24/2015  2017 [employer]vier  

## 2016-09-04 NOTE — Progress Notes (Signed)
Subjective:  Patient ID: Rachael Fischer, female    DOB: 09-Nov-1965  Age: 51 y.o. MRN: [id number]  CC: Establish care/physical  HPI Rachael Fischer is a 51 y.o. female presents to the clinic today to establish care. She is in need of a physical.  Preventative Healthcare  Pap smear: In need of. Wants to see GYN.  Mammogram: In need of. Will order today.  Colonoscopy: In need of. Will place referral.  Immunizations  Tetanus - Up to date  Flu - Up to date.  Labs: Labs today. See orders.   Exercise: Starting back exercising.  Alcohol use: See below.  Smoking/tobacco use: Nonsmoker.  PMH, Surgical Hx, Family Hx, Social History reviewed and updated as below.  Past Medical History:  Diagnosis Date  . Chicken pox   . Plantar fasciitis    Past Surgical History:  Procedure Laterality Date  . FASCIOTOMY FOOT / TOE Left    Family History  Problem Relation Age of Onset  . Diabetes Mother   . Hypertension Mother   . Hyperlipidemia Mother   . Arthritis Mother   . Hyperlipidemia Father    Social History  Substance Use Topics  . Smoking status: Never Smoker  . Smokeless tobacco: Never Used  . Alcohol use 0.0 - 0.6 oz/week   Review of Systems  Psychiatric/Behavioral:       Anxiety, stress.  All other systems reviewed and are negative.  Objective:   Today's Vitals: BP 134/84   Pulse 84   Temp 98.4 F (36.9 C) (Oral)   Ht 5' 4.5" (1.638 m)   Wt 153 lb 9.6 oz (69.7 kg)   SpO2 100%   BMI 25.96 kg/m   Physical Exam  Constitutional: She is oriented to person, place, and time. She appears well-developed and well-nourished. No distress.  HENT:  Head: Normocephalic and atraumatic.  Nose: Nose normal.  Mouth/Throat: Oropharynx is clear and moist. No oropharyngeal exudate.  Normal TM's bilaterally.   Eyes: Conjunctivae are normal. No scleral icterus.  Neck: Neck supple.  Cardiovascular: Normal rate and regular rhythm.   No murmur heard. Pulmonary/Chest: Effort normal  and breath sounds normal. She has no wheezes. She has no rales.  Abdominal: Soft. She exhibits no distension. There is no tenderness. There is no rebound and no guarding.  Musculoskeletal: Normal range of motion. She exhibits no edema.  Lymphadenopathy:    She has no cervical adenopathy.  Neurological: She is alert and oriented to person, place, and time.  Skin: Skin is warm and dry. No rash noted.  Psychiatric: She has a normal mood and affect.  Vitals reviewed.  Assessment & Plan:   Problem List Items Addressed This Visit    Annual physical exam - Primary    Patient going to GYN for Pap smear. Mammogram ordered. Patient to schedule. Referral to GI place for colonoscopy. Immunizations up-to-date. Labs today. BP slightly elevated today. Patient is an [employer] fund[employer]. She is to monitor closely. BP is persistently greater than 130/80, we will need to discuss treatment options.      Relevant Orders   CBC   Hemoglobin A1c   Comprehensive metabolic panel   Lipid panel    Other Visit Diagnoses    Screening for breast cancer       Relevant Orders   MM DIGITAL SCREENING BILATERAL   Encounter for screening colonoscopy       Relevant Orders   Ambulatory referral to Gastroenterology     Outpatient Encounter Prescriptions as of  09/04/2016  Medication Sig  . etodolac (LODINE XL) 400 MG 24 hr tablet Take 1 tablet (400 mg total) by mouth daily.  Marland Kitchen. levonorgestrel (MIRENA, 52 MG,) 20 MCG/24HR IUD 1 each by Intrauterine route once.   No facility-administered encounter medications on file as of 09/04/2016.     Follow-up: Annually  Everlene OtherJayce Elonda Giuliano DO [facility]

## 2016-09-05 ENCOUNTER — Telehealth: Payer: Self-pay | Admitting: Family Medicine

## 2016-09-05 ENCOUNTER — Encounter: Payer: Self-pay | Admitting: Family Medicine

## 2016-09-05 NOTE — Telephone Encounter (Signed)
Pt was called to inform her that her form was ready for pick up and placed up front.

## 2016-11-06 ENCOUNTER — Encounter: Payer: Self-pay | Admitting: Family Medicine

## 2017-03-12 ENCOUNTER — Encounter: Payer: Self-pay | Admitting: Family Medicine

## 2017-07-22 DIAGNOSIS — H5213 Myopia, bilateral: Secondary | ICD-10-CM | POA: Diagnosis not present

## 2017-08-19 DIAGNOSIS — M7711 Lateral epicondylitis, right elbow: Secondary | ICD-10-CM | POA: Diagnosis not present

## 2017-09-22 ENCOUNTER — Other Ambulatory Visit: Payer: Self-pay

## 2017-09-22 ENCOUNTER — Emergency Department: Payer: 59

## 2017-09-22 ENCOUNTER — Emergency Department: Payer: 59 | Admitting: Anesthesiology

## 2017-09-22 ENCOUNTER — Encounter: Payer: Self-pay | Admitting: Emergency Medicine

## 2017-09-22 ENCOUNTER — Observation Stay
Admission: EM | Admit: 2017-09-22 | Discharge: 2017-09-23 | Disposition: A | Discharge status: Home / Self Care | Payer: 59 | Attending: General Surgery | Admitting: General Surgery

## 2017-09-22 ENCOUNTER — Encounter
Admission: EM | Disposition: A | Discharge status: Home / Self Care | Payer: Self-pay | Source: Home / Self Care | Attending: Student in an Organized Health Care Education/Training Program

## 2017-09-22 DIAGNOSIS — Z975 Presence of (intrauterine) contraceptive device: Secondary | ICD-10-CM | POA: Diagnosis not present

## 2017-09-22 DIAGNOSIS — K3580 Unspecified acute appendicitis: Secondary | ICD-10-CM | POA: Diagnosis not present

## 2017-09-22 DIAGNOSIS — Z791 Long term (current) use of non-steroidal anti-inflammatories (NSAID): Secondary | ICD-10-CM | POA: Insufficient documentation

## 2017-09-22 DIAGNOSIS — R109 Unspecified abdominal pain: Secondary | ICD-10-CM | POA: Diagnosis not present

## 2017-09-22 DIAGNOSIS — K353 Acute appendicitis with localized peritonitis, without perforation or gangrene: Secondary | ICD-10-CM

## 2017-09-22 DIAGNOSIS — R1031 Right lower quadrant pain: Secondary | ICD-10-CM | POA: Diagnosis not present

## 2017-09-22 DIAGNOSIS — K37 Unspecified appendicitis: Secondary | ICD-10-CM | POA: Diagnosis not present

## 2017-09-22 HISTORY — PX: LAPAROSCOPIC APPENDECTOMY: SHX408

## 2017-09-22 LAB — COMPREHENSIVE METABOLIC PANEL
ALK PHOS: 94 U/L (ref 38–126)
ALT: 18 U/L (ref 14–54)
ANION GAP: 10 (ref 5–15)
AST: 27 U/L (ref 15–41)
Albumin: 4.5 g/dL (ref 3.5–5.0)
BILIRUBIN TOTAL: 1.2 mg/dL (ref 0.3–1.2)
BUN: 14 mg/dL (ref 6–20)
CALCIUM: 9.4 mg/dL (ref 8.9–10.3)
CO2: 27 mmol/L (ref 22–32)
Chloride: 103 mmol/L (ref 101–111)
Creatinine, Ser: 0.81 mg/dL (ref 0.44–1.00)
GFR calc Af Amer: >60 mL/min (ref >60)
Glucose, Bld: 112 mg/dL — ABNORMAL HIGH (ref 65–99)
POTASSIUM: 3.6 mmol/L (ref 3.5–5.1)
Sodium: 140 mmol/L (ref 135–145)
TOTAL PROTEIN: 8.2 g/dL — AB (ref 6.5–8.1)

## 2017-09-22 LAB — URINALYSIS, COMPLETE (UACMP) WITH MICROSCOPIC
BACTERIA UA: NONE SEEN
BILIRUBIN URINE: NEGATIVE
GLUCOSE, UA: NEGATIVE mg/dL
Ketones, ur: NEGATIVE mg/dL
NITRITE: NEGATIVE
PH: 6 (ref 5.0–8.0)
Protein, ur: NEGATIVE mg/dL
SPECIFIC GRAVITY, URINE: 1.014 (ref 1.005–1.030)

## 2017-09-22 LAB — CBC
HEMATOCRIT: 44.6 % (ref 35.0–47.0)
Hemoglobin: 15.2 g/dL (ref 12.0–16.0)
MCH: 30.5 pg (ref 26.0–34.0)
MCHC: 34.1 g/dL (ref 32.0–36.0)
MCV: 89.5 fL (ref 80.0–100.0)
Platelets: 297 10*3/uL (ref 150–440)
RBC: 4.99 MIL/uL (ref 3.80–5.20)
RDW: 13 % (ref 11.5–14.5)
WBC: 12.9 10*3/uL — ABNORMAL HIGH (ref 3.6–11.0)

## 2017-09-22 LAB — POCT PREGNANCY, URINE: PREG TEST UR: NEGATIVE

## 2017-09-22 LAB — LIPASE, BLOOD: Lipase: 31 U/L (ref 11–51)

## 2017-09-22 SURGERY — APPENDECTOMY, LAPAROSCOPIC
Anesthesia: General

## 2017-09-22 MED ORDER — SUGAMMADEX SODIUM 200 MG/2ML IV SOLN
INTRAVENOUS | Status: DC | PRN
  Administered 2017-09-22: 140 mg via INTRAVENOUS

## 2017-09-22 MED ORDER — ONDANSETRON HCL 4 MG/2ML IJ SOLN
INTRAMUSCULAR | Status: AC
  Filled 2017-09-22: qty 2

## 2017-09-22 MED ORDER — ROCURONIUM BROMIDE 100 MG/10ML IV SOLN
INTRAVENOUS | Status: DC | PRN
  Administered 2017-09-22: 35 mg via INTRAVENOUS

## 2017-09-22 MED ORDER — FENTANYL CITRATE (PF) 100 MCG/2ML IJ SOLN
25.0000 ug | INTRAMUSCULAR | Status: DC | PRN
  Administered 2017-09-22 (×4): 25 ug via INTRAVENOUS

## 2017-09-22 MED ORDER — ACETAMINOPHEN 10 MG/ML IV SOLN
INTRAVENOUS | Status: AC
  Filled 2017-09-22: qty 100

## 2017-09-22 MED ORDER — FENTANYL CITRATE (PF) 100 MCG/2ML IJ SOLN
INTRAMUSCULAR | Status: AC
  Filled 2017-09-22: qty 4

## 2017-09-22 MED ORDER — PROMETHAZINE HCL 25 MG/ML IJ SOLN: 12.5000 mg | INTRAMUSCULAR | Status: DC | PRN

## 2017-09-22 MED ORDER — ONDANSETRON HCL 4 MG/2ML IJ SOLN
INTRAMUSCULAR | Status: DC | PRN
  Administered 2017-09-22: 4 mg via INTRAVENOUS

## 2017-09-22 MED ORDER — HYDROCODONE-ACETAMINOPHEN 5-325 MG PO TABS: 1.0000 | ORAL_TABLET | ORAL | Status: DC | PRN

## 2017-09-22 MED ORDER — PIPERACILLIN-TAZOBACTAM 3.375 G IVPB: 3.3750 g | Freq: Three times a day (TID) | INTRAVENOUS | Status: DC

## 2017-09-22 MED ORDER — FAMOTIDINE IN NACL 20-0.9 MG/50ML-% IV SOLN
20.0000 mg | Freq: Once | INTRAVENOUS | Status: AC
  Administered 2017-09-22: 20 mg via INTRAVENOUS
  Filled 2017-09-22: qty 50

## 2017-09-22 MED ORDER — BUPIVACAINE-EPINEPHRINE 0.5% -1:200000 IJ SOLN
INTRAMUSCULAR | Status: DC | PRN
  Administered 2017-09-22: 30 mL

## 2017-09-22 MED ORDER — LIDOCAINE HCL (PF) 2 % IJ SOLN
INTRAMUSCULAR | Status: AC
  Filled 2017-09-22: qty 10

## 2017-09-22 MED ORDER — FENTANYL CITRATE (PF) 100 MCG/2ML IJ SOLN
INTRAMUSCULAR | Status: AC
  Administered 2017-09-22: 25 ug via INTRAVENOUS
  Filled 2017-09-22: qty 2

## 2017-09-22 MED ORDER — ONDANSETRON HCL 4 MG/2ML IJ SOLN: 4.0000 mg | Freq: Once | INTRAMUSCULAR | Status: DC | PRN

## 2017-09-22 MED ORDER — LACTATED RINGERS IV SOLN: INTRAVENOUS | Status: DC

## 2017-09-22 MED ORDER — PROMETHAZINE HCL 25 MG/ML IJ SOLN
12.5000 mg | Freq: Four times a day (QID) | INTRAMUSCULAR | Status: DC | PRN
  Administered 2017-09-22: 12.5 mg via INTRAVENOUS
  Filled 2017-09-22: qty 1

## 2017-09-22 MED ORDER — FAMOTIDINE 20 MG IN NS 100 ML IVPB
20.0000 mg | Freq: Once | INTRAVENOUS | Status: DC
  Filled 2017-09-22: qty 100

## 2017-09-22 MED ORDER — IOPAMIDOL (ISOVUE-300) INJECTION 61%
100.0000 mL | Freq: Once | INTRAVENOUS | Status: AC | PRN
  Administered 2017-09-22: 100 mL via INTRAVENOUS

## 2017-09-22 MED ORDER — FENTANYL CITRATE (PF) 100 MCG/2ML IJ SOLN
INTRAMUSCULAR | Status: DC | PRN
  Administered 2017-09-22: 25 ug via INTRAVENOUS
  Administered 2017-09-22: 100 ug via INTRAVENOUS

## 2017-09-22 MED ORDER — KETOROLAC TROMETHAMINE 30 MG/ML IJ SOLN
INTRAMUSCULAR | Status: DC | PRN
  Administered 2017-09-22: 30 mg via INTRAVENOUS

## 2017-09-22 MED ORDER — MORPHINE SULFATE (PF) 2 MG/ML IV SOLN: 2.0000 mg | INTRAVENOUS | Status: DC | PRN

## 2017-09-22 MED ORDER — LACTATED RINGERS IV SOLN
INTRAVENOUS | Status: DC
  Administered 2017-09-22: 09:00:00 via INTRAVENOUS

## 2017-09-22 MED ORDER — MIDAZOLAM HCL 2 MG/2ML IJ SOLN
INTRAMUSCULAR | Status: AC
Start: 2017-09-22 — End: 2017-09-22
  Filled 2017-09-22: qty 2

## 2017-09-22 MED ORDER — SUGAMMADEX SODIUM 200 MG/2ML IV SOLN
INTRAVENOUS | Status: AC
  Filled 2017-09-22: qty 2

## 2017-09-22 MED ORDER — BUPIVACAINE HCL (PF) 0.5 % IJ SOLN
INTRAMUSCULAR | Status: AC
  Filled 2017-09-22: qty 30

## 2017-09-22 MED ORDER — ACETAMINOPHEN 10 MG/ML IV SOLN
INTRAVENOUS | Status: DC | PRN
  Administered 2017-09-22: 1000 mg via INTRAVENOUS

## 2017-09-22 MED ORDER — PROPOFOL 10 MG/ML IV BOLUS
INTRAVENOUS | Status: AC
  Filled 2017-09-22: qty 20

## 2017-09-22 MED ORDER — METOCLOPRAMIDE HCL 5 MG/ML IJ SOLN: 10.0000 mg | Freq: Once | INTRAMUSCULAR | Status: DC

## 2017-09-22 MED ORDER — DEXAMETHASONE SODIUM PHOSPHATE 10 MG/ML IJ SOLN
INTRAMUSCULAR | Status: AC
  Filled 2017-09-22: qty 1

## 2017-09-22 MED ORDER — SODIUM CHLORIDE 0.9 % IV BOLUS (SEPSIS)
1000.0000 mL | Freq: Once | INTRAVENOUS | Status: AC
  Administered 2017-09-22: 1000 mL via INTRAVENOUS

## 2017-09-22 MED ORDER — ACETAMINOPHEN 325 MG PO TABS
650.0000 mg | ORAL_TABLET | ORAL | Status: DC | PRN
  Administered 2017-09-22: 650 mg via ORAL
  Filled 2017-09-22: qty 2

## 2017-09-22 MED ORDER — DEXAMETHASONE SODIUM PHOSPHATE 10 MG/ML IJ SOLN
INTRAMUSCULAR | Status: DC | PRN
  Administered 2017-09-22: 8 mg via INTRAVENOUS

## 2017-09-22 MED ORDER — LIDOCAINE HCL (CARDIAC) 20 MG/ML IV SOLN
INTRAVENOUS | Status: DC | PRN
  Administered 2017-09-22: 20 mg via INTRAVENOUS
  Administered 2017-09-22: 60 mg via INTRAVENOUS

## 2017-09-22 MED ORDER — PIPERACILLIN-TAZOBACTAM 3.375 G IVPB 30 MIN
3.3750 g | Freq: Once | INTRAVENOUS | Status: AC
  Administered 2017-09-22: 3.375 g via INTRAVENOUS
  Filled 2017-09-22: qty 50

## 2017-09-22 MED ORDER — MORPHINE SULFATE (PF) 4 MG/ML IV SOLN
4.0000 mg | INTRAVENOUS | Status: DC | PRN
  Administered 2017-09-22: 4 mg via INTRAVENOUS
  Filled 2017-09-22: qty 1

## 2017-09-22 MED ORDER — LEVONORGESTREL 20 MCG/24HR IU IUD: 1.0000 | INTRAUTERINE_SYSTEM | Freq: Once | INTRAUTERINE | Status: DC

## 2017-09-22 MED ORDER — LACTATED RINGERS IV SOLN
INTRAVENOUS | Status: DC
  Administered 2017-09-22: 15:00:00 via INTRAVENOUS

## 2017-09-22 MED ORDER — PROPOFOL 10 MG/ML IV BOLUS
INTRAVENOUS | Status: DC | PRN
  Administered 2017-09-22: 130 mg via INTRAVENOUS

## 2017-09-22 MED ORDER — MIDAZOLAM HCL 2 MG/2ML IJ SOLN
INTRAMUSCULAR | Status: DC | PRN
  Administered 2017-09-22: 2 mg via INTRAVENOUS

## 2017-09-22 MED ORDER — PHENYLEPHRINE HCL 10 MG/ML IJ SOLN
INTRAMUSCULAR | Status: DC | PRN
  Administered 2017-09-22: 50 ug via INTRAVENOUS
  Administered 2017-09-22: 100 ug via INTRAVENOUS

## 2017-09-22 SURGICAL SUPPLY — 38 items
BLADE SURG 11 STRL SS SAFETY (MISCELLANEOUS) ×3 IMPLANT
CANISTER SUCT 1200ML W/VALVE (MISCELLANEOUS) ×3 IMPLANT
CANNULA DILATOR 10 W/SLV (CANNULA) ×4 IMPLANT
CANNULA DILATOR 10MM W/SLV (CANNULA) ×2
CHLORAPREP W/TINT 26ML (MISCELLANEOUS) ×3 IMPLANT
CLOSURE WOUND 1/2 X4 (GAUZE/BANDAGES/DRESSINGS) ×1
CUTTER FLEX LINEAR 45M (STAPLE) ×3 IMPLANT
DRSG TEGADERM 2-3/8X2-3/4 SM (GAUZE/BANDAGES/DRESSINGS) ×9 IMPLANT
DRSG TELFA 4X3 1S NADH ST (GAUZE/BANDAGES/DRESSINGS) ×3 IMPLANT
ELECT REM PT RETURN 9FT ADLT (ELECTROSURGICAL) ×3
ELECTRODE REM PT RTRN 9FT ADLT (ELECTROSURGICAL) ×1 IMPLANT
GLOVE BIO SURGEON STRL SZ7.5 (GLOVE) ×3 IMPLANT
GLOVE INDICATOR 8.0 STRL GRN (GLOVE) ×3 IMPLANT
GOWN STRL REUS W/ TWL LRG LVL3 (GOWN DISPOSABLE) ×2 IMPLANT
GOWN STRL REUS W/TWL LRG LVL3 (GOWN DISPOSABLE) ×4
GRASPER SUT TROCAR 14GX15 (MISCELLANEOUS) ×3 IMPLANT
IRRIGATION STRYKERFLOW (MISCELLANEOUS) IMPLANT
IRRIGATOR STRYKERFLOW (MISCELLANEOUS)
IV LACTATED RINGERS 1000ML (IV SOLUTION) IMPLANT
KIT TURNOVER KIT A (KITS) ×3 IMPLANT
LABEL OR SOLS (LABEL) IMPLANT
NDL INSUFF ACCESS 14 VERSASTEP (NEEDLE) ×3 IMPLANT
NEEDLE HYPO 22GX1.5 SAFETY (NEEDLE) ×3 IMPLANT
NS IRRIG 1000ML POUR BTL (IV SOLUTION) IMPLANT
PACK LAP CHOLECYSTECTOMY (MISCELLANEOUS) ×3 IMPLANT
POUCH ENDO CATCH 10MM SPEC (MISCELLANEOUS) ×3 IMPLANT
RELOAD 45 VASCULAR/THIN (ENDOMECHANICALS) IMPLANT
RELOAD STAPLE TA45 3.5 REG BLU (ENDOMECHANICALS) ×3 IMPLANT
SEAL FOR SCOPE WARMER C3101 (MISCELLANEOUS) IMPLANT
STRIP CLOSURE SKIN 1/2X4 (GAUZE/BANDAGES/DRESSINGS) ×2 IMPLANT
SUT VIC AB 0 CT2 27 (SUTURE) ×3 IMPLANT
SUT VIC AB 4-0 FS2 27 (SUTURE) ×3 IMPLANT
SWABSTK COMLB BENZOIN TINCTURE (MISCELLANEOUS) ×3 IMPLANT
SYR 10ML LL (SYRINGE) IMPLANT
TRAY FOLEY W/METER SILVER 16FR (SET/KITS/TRAYS/PACK) IMPLANT
TROCAR XCEL 12X100 BLDLESS (ENDOMECHANICALS) ×3 IMPLANT
TROCAR XCEL NON-BLD 11X100MML (ENDOMECHANICALS) ×3 IMPLANT
TUBING INSUFFLATION (TUBING) ×3 IMPLANT

## 2017-09-22 NOTE — Anesthesia Preprocedure Evaluation (Signed)
Anesthesia Evaluation  Patient identified by MRN, date of birth, ID band Patient awake    Reviewed: Allergy & Precautions, NPO status , Patient's Chart, lab work & pertinent test results  Airway Mallampati: III  TM Distance: <3 FB     Dental  (+) Caps   Pulmonary neg pulmonary ROS,    Pulmonary exam normal        Cardiovascular negative cardio ROS Normal cardiovascular exam     Neuro/Psych negative neurological ROS  negative psych ROS   GI/Hepatic Neg liver ROS, Acute appendix   Endo/Other  negative endocrine ROS  Renal/GU negative Renal ROS  negative genitourinary   Musculoskeletal negative musculoskeletal ROS (+)   Abdominal (+)  Abdomen: tender.    Peds negative pediatric ROS (+)  Hematology negative hematology ROS (+)   Anesthesia Other Findings   Reproductive/Obstetrics                             Anesthesia Physical Anesthesia Plan  ASA: II and emergent  Anesthesia Plan: General   Post-op Pain Management:    Induction: Intravenous  PONV Risk Score and Plan:   Airway Management Planned: Oral ETT  Additional Equipment:   Intra-op Plan:   Post-operative Plan: Extubation in OR  Informed Consent: I have reviewed the patients History and Physical, chart, labs and discussed the procedure including the risks, benefits and alternatives for the proposed anesthesia with the patient or authorized representative who has indicated his/her understanding and acceptance.   Dental advisory given  Plan Discussed with: CRNA and Surgeon  Anesthesia Plan Comments:         Anesthesia Quick Evaluation

## 2017-09-22 NOTE — ED Provider Notes (Signed)
[facility] Emergency Department Provider Note    None    (approximate)  I have reviewed the triage vital signs and the nursing notes.   HISTORY  Chief Complaint Abdominal Pain    HPI Rachael Fischer is a 52 y.o. female no significant past medical history or recent admissions to the hospital presents with complaint of right lower quadrant pain that started on Sunday he felt that it got a little bit better Monday morning so she "talked it out ".  Overnight started developing worsening pain in the right lower quadrant.  Describes it as a fullness and bloating.  She has not had anything to eat.  Does have some nausea without vomiting.  No measured fevers.  Denies any dysuria.  No diarrhea.  Is not pregnant.  Still has her appendix.  Past Medical History:  Diagnosis Date  . Chicken pox   . Plantar fasciitis    Family History  Problem Relation Age of Onset  . Diabetes Mother   . Hypertension Mother   . Hyperlipidemia Mother   . Arthritis Mother   . Hyperlipidemia Father    Past Surgical History:  Procedure Laterality Date  . FASCIOTOMY FOOT / TOE Left    Patient Active Problem List   Diagnosis Date Noted  . Annual physical exam 09/04/2016      Prior to Admission medications   Medication Sig Start Date End Date Taking? Authorizing Provider  levonorgestrel (MIRENA, 52 MG,) 20 MCG/24HR IUD 1 each by Intrauterine route once.   Yes [provider]  etodolac (LODINE XL) 400 MG 24 hr tablet Take 1 tablet (400 mg total) by mouth daily. 09/01/16   Hyatt, Max T, DPM    Allergies Patient has no known allergies.    Social History Social History   Tobacco Use  . Smoking status: Never Smoker  . Smokeless tobacco: Never Used  Substance Use Topics  . Alcohol use: Yes    Alcohol/week: 0.0 - 0.6 oz  . Drug use: No    Review of Systems Patient denies headaches, rhinorrhea, blurry vision, numbness, shortness of breath, chest pain, edema, cough,  abdominal pain, nausea, vomiting, diarrhea, dysuria, fevers, rashes or hallucinations unless otherwise stated above in HPI. ____________________________________________   PHYSICAL EXAM:  VITAL SIGNS: Vitals:   09/22/17 1030 09/22/17 1100  BP: 138/69 (!) 149/77  Pulse: 97 96  Resp: 19 14  Temp:    SpO2: 98% 98%    Constitutional: Alert and oriented. Well appearing and in no acute distress. Eyes: Conjunctivae are normal.  Head: Atraumatic. Nose: No congestion/rhinnorhea. Mouth/Throat: Mucous membranes are moist.   Neck: No stridor. Painless ROM.  Cardiovascular: Normal rate, regular rhythm. Grossly normal heart sounds.  Good peripheral circulation. Respiratory: Normal respiratory effort.  No retractions. Lungs CTAB. Gastrointestinal: Soft with ttp in RLQ with + rovsigs sign. No distention. No abdominal bruits. No CVA tenderness. Genitourinary:  Musculoskeletal: No lower extremity tenderness nor edema.  No joint effusions. Neurologic:  Normal speech and language. No gross focal neurologic deficits are appreciated. No facial droop Skin:  Skin is warm, dry and intact. No rash noted. Psychiatric: Mood and affect are normal. Speech and behavior are normal.  ____________________________________________   LABS (all labs ordered are listed, but only abnormal results are displayed)  Results for orders placed or performed during the hospital encounter of 09/22/17 (from the past 24 hour(s))  Lipase, blood     Status: None   Collection Time: 09/22/17  7:50 AM  Result Value Ref Range   Lipase 31 11 - 51 U/L  Comprehensive metabolic panel     Status: Abnormal   Collection Time: 09/22/17  7:50 AM  Result Value Ref Range   Sodium 140 135 - 145 mmol/L   Potassium 3.6 3.5 - 5.1 mmol/L   Chloride 103 101 - 111 mmol/L   CO2 27 22 - 32 mmol/L   Glucose, Bld 112 (H) 65 - 99 mg/dL   BUN 14 6 - 20 mg/dL   Creatinine, Ser 1.19 0.44 - 1.00 mg/dL   Calcium 9.4 8.9 - 14.7 mg/dL   Total Protein  8.2 (H) 6.5 - 8.1 g/dL   Albumin 4.5 3.5 - 5.0 g/dL   AST 27 15 - 41 U/L   ALT 18 14 - 54 U/L   Alkaline Phosphatase 94 38 - 126 U/L   Total Bilirubin 1.2 0.3 - 1.2 mg/dL   GFR calc non Af Amer >60 >60 mL/min   GFR calc Af Amer >60 >60 mL/min   Anion gap 10 5 - 15  CBC     Status: Abnormal   Collection Time: 09/22/17  7:50 AM  Result Value Ref Range   WBC 12.9 (H) 3.6 - 11.0 K/uL   RBC 4.99 3.80 - 5.20 MIL/uL   Hemoglobin 15.2 12.0 - 16.0 g/dL   HCT 82.9 56.2 - 13.0 %   MCV 89.5 80.0 - 100.0 fL   MCH 30.5 26.0 - 34.0 pg   MCHC 34.1 32.0 - 36.0 g/dL   RDW 86.5 78.4 - 69.6 %   Platelets 297 150 - 440 K/uL  Urinalysis, Complete w Microscopic     Status: Abnormal   Collection Time: 09/22/17  7:51 AM  Result Value Ref Range   Color, Urine YELLOW (A) YELLOW   APPearance CLEAR (A) CLEAR   Specific Gravity, Urine 1.014 1.005 - 1.030   pH 6.0 5.0 - 8.0   Glucose, UA NEGATIVE NEGATIVE mg/dL   Hgb urine dipstick MODERATE (A) NEGATIVE   Bilirubin Urine NEGATIVE NEGATIVE   Ketones, ur NEGATIVE NEGATIVE mg/dL   Protein, ur NEGATIVE NEGATIVE mg/dL   Nitrite NEGATIVE NEGATIVE   Leukocytes, UA SMALL (A) NEGATIVE   RBC / HPF 0-5 0 - 5 RBC/hpf   WBC, UA 0-5 0 - 5 WBC/hpf   Bacteria, UA NONE SEEN NONE SEEN   Squamous Epithelial / LPF 0-5 (A) NONE SEEN   Mucus PRESENT   Pregnancy, urine POC     Status: None   Collection Time: 09/22/17  8:02 AM  Result Value Ref Range   Preg Test, Ur NEGATIVE NEGATIVE   ____________________________________________  EKG My review and personal interpretation at Time: 8:27   Indication: abdominal pain  Rate: 110  Rhythm: sinus Axis: normal Other: nonspecific st changes, no stemi, ____________________________________________  RADIOLOGY  I personally reviewed all radiographic images ordered to evaluate for the above acute complaints and reviewed radiology reports and findings.  These findings were personally discussed with the patient.  Please see medical  record for radiology report.  ____________________________________________   PROCEDURES  Procedure(s) performed:  Procedures    Critical Care performed: no ____________________________________________   INITIAL IMPRESSION / ASSESSMENT AND PLAN / ED COURSE  Pertinent labs & imaging results that were available during my care of the patient were reviewed by me and considered in my medical decision making (see chart for details).  DDX: Appendicitis, colitis, perforation, diverticulitis, UTI, stone, ovarian cyst, torsion  Rachael Fischer is a 52 y.o.  who presents to the ED with symptoms as described above.  Given her exam, tachycardia, anorexia and low-grade temperature is suspicious for acute appendicitis therefore will order stat CT imaging.  Will provide IV fluids as well as IV pain and IV antiemetic medications.  Clinical Course as of Sep 22 1124  Tue Sep 22, 2017  0912 My review of CT imaging it does appear consistent with acute appendicitis.  Fluids IV antibiotics and surgical consultation.  [PR]  [id number] Imaging does confirm acute appendicitis.  No evidence of rupture.  I spoke with Dr. Excell Seltzer of general surgery who will admit the patient for further evaluation and management.  [PR]    Clinical Course User Index [PR] Willy Eddy, MD     ____________________________________________   FINAL CLINICAL IMPRESSION(S) / ED DIAGNOSES  Final diagnoses:  Acute appendicitis with localized peritonitis      NEW MEDICATIONS STARTED DURING THIS VISIT:  New Prescriptions   No medications on file     Note:  This document was prepared using Dragon voice recognition software and may include unintentional dictation errors.    Willy Eddy, MD 09/22/17 1126

## 2017-09-22 NOTE — ED Notes (Signed)
OR tech at bedside, patient transferred to OR stretcher without incident. Pt take to OR by OR tech.

## 2017-09-22 NOTE — H&P (Signed)
Rachael Fischer is an 52 y.o. female.   Chief Complaint: Abdominal pain.  HPI:  Healthy 52 y/o woman well until February 17 when she developed belching at later that evening RLQ discomfort. She slept well that night, but yesterday noted persistent symptoms. RLQ pain abated somewhat, but recurred yesterday evening. Presented to the ED today.  CT shows evidence of acute appendicitis.  IUD. Rare EtOH. Non-smoker.   Past Medical History:  Diagnosis Date  . Chicken pox   . Plantar fasciitis     Past Surgical History:  Procedure Laterality Date  . FASCIOTOMY FOOT / TOE Left     Family History  Problem Relation Age of Onset  . Diabetes Mother   . Hypertension Mother   . Hyperlipidemia Mother   . Arthritis Mother   . Hyperlipidemia Father    Social History:  reports that  has never smoked. she has never used smokeless tobacco. She reports that she drinks alcohol. She reports that she does not use drugs.  Allergies: No Known Allergies   (Not in a hospital admission)  Results for orders placed or performed during the hospital encounter of 09/22/17 (from the past 48 hour(s))  Lipase, blood     Status: None   Collection Time: 09/22/17  7:50 AM  Result Value Ref Range   Lipase 31 11 - 51 U/L    Comment: Performed at [facility], [address] [place], [place] [address]  Comprehensive metabolic panel     Status: Abnormal   Collection Time: 09/22/17  7:50 AM  Result Value Ref Range   Sodium 140 135 - 145 mmol/L   Potassium 3.6 3.5 - 5.1 mmol/L   Chloride 103 101 - 111 mmol/L   CO2 27 22 - 32 mmol/L   Glucose, Bld 112 (H) 65 - 99 mg/dL   BUN 14 6 - 20 mg/dL   Creatinine, Ser 0.81 0.44 - 1.00 mg/dL   Calcium 9.4 8.9 - 10.3 mg/dL   Total Protein 8.2 (H) 6.5 - 8.1 g/dL   Albumin 4.5 3.5 - 5.0 g/dL   AST 27 15 - 41 U/L   ALT 18 14 - 54 U/L   Alkaline Phosphatase 94 38 - 126 U/L   Total Bilirubin 1.2 0.3 - 1.2 mg/dL   GFR calc non Af Amer >60 >60 mL/min   GFR calc Af  Amer >60 >60 mL/min    Comment: (NOTE) The eGFR has been calculated using the CKD EPI equation. This calculation has not been validated in all clinical situations. eGFR's persistently <60 mL/min signify possible Chronic Kidney Disease.    Anion gap 10 5 - 15    Comment: Performed at [facility], [address] [place], [place] [address]  CBC     Status: Abnormal   Collection Time: 09/22/17  7:50 AM  Result Value Ref Range   WBC 12.9 (H) 3.6 - 11.0 K/uL   RBC 4.99 3.80 - 5.20 MIL/uL   Hemoglobin 15.2 12.0 - 16.0 g/dL   HCT 44.6 35.0 - 47.0 %   MCV 89.5 80.0 - 100.0 fL   MCH 30.5 26.0 - 34.0 pg   MCHC 34.1 32.0 - 36.0 g/dL   RDW 13.0 11.5 - 14.5 %   Platelets 297 150 - 440 K/uL    Comment: Performed at [facility], [address] [place], [place] [address]  Urinalysis, Complete w Microscopic     Status: Abnormal   Collection Time: 09/22/17  7:51 AM  Result Value Ref Range   Color, Urine YELLOW (A) YELLOW   APPearance CLEAR (A) CLEAR   Specific Gravity, Urine 1.014 1.005 - 1.030   pH 6.0 5.0 - 8.0   Glucose, UA NEGATIVE NEGATIVE mg/dL   Hgb urine dipstick MODERATE (A) NEGATIVE   Bilirubin Urine NEGATIVE NEGATIVE   Ketones, ur NEGATIVE NEGATIVE mg/dL   Protein, ur NEGATIVE NEGATIVE mg/dL   Nitrite NEGATIVE NEGATIVE   Leukocytes, UA SMALL (A) NEGATIVE   RBC / HPF 0-5 0 - 5 RBC/hpf   WBC, UA 0-5 0 - 5 WBC/hpf   Bacteria, UA NONE SEEN NONE SEEN   Squamous Epithelial / LPF 0-5 (A) NONE SEEN   Mucus PRESENT     Comment: Performed at [facility], [address] [place],  [address]  Pregnancy, urine POC     Status: None   Collection Time: 09/22/17  8:02 AM  Result Value Ref Range   Preg Test, Ur NEGATIVE NEGATIVE    Comment:        THE SENSITIVITY OF THIS METHODOLOGY IS >24 mIU/mL    Ct Abdomen Pelvis W Contrast  Result Date: 09/22/2017 CLINICAL DATA:  Right lower quadrant abdominal pain beginning 2 days ago. EXAM: CT ABDOMEN AND  PELVIS WITH CONTRAST TECHNIQUE: Multidetector CT imaging of the abdomen and pelvis was performed using the standard protocol following bolus administration of intravenous contrast. CONTRAST:  18m ISOVUE-300 IOPAMIDOL (ISOVUE-300) INJECTION 61% COMPARISON:  None. FINDINGS: Lower chest: Normal except for spinal curvature and some chest asymmetry secondary to that. Hepatobiliary: Liver parenchyma is normal except for a 12 mm simple cyst in the ventral left lobe of the liver and a 3 mm simple cyst in the central portion of the right lobe. No calcified gallstones. Pancreas: Normal Spleen: Normal Adrenals/Urinary Tract: Adrenal glands are normal. Kidneys are normal. No cyst, mass, stone or hydronephrosis. Bladder is normal. Stomach/Bowel: There is acute appendicitis. See below. Remainder of the intestine is normal. Appendix: Location: Right iliac fossa, extending straight caudal from the cecum. Diameter: 16 mm Appendicolith: None seen Mucosal hyper-enhancement: Moderate Extraluminal gas: None Periappendiceal collection: Generalized fat stranding without collection. Vascular/Lymphatic: Normal Reproductive: IUD present within a normal appearing uterus. No ovarian lesion. Other: No free fluid or air. Musculoskeletal: Thoracic curvature convex to the right and thoracolumbar curvature convex to the left. No acute bone finding. IMPRESSION: Acute unruptured appendicitis. Edematous inflammatory changes surrounding the appendix but no evidence of abscess. Electronically Signed   By: MNelson ChimesM.D.   On: 09/22/2017 09:18    Review of Systems  Constitutional: Negative.   HENT: Negative.   Eyes: Negative.   Respiratory: Negative.   Cardiovascular: Negative.   Gastrointestinal: Positive for abdominal pain and heartburn.  Genitourinary: Negative.   Musculoskeletal: Negative.   Skin: Negative.   Neurological: Negative.   Endo/Heme/Allergies: Negative.   Psychiatric/Behavioral: Negative.     Blood pressure (!)  155/79, pulse (!) 115, temperature 99.1 F (37.3 C), temperature source Oral, resp. rate 20, height _0  (1.651 m), weight 150 lb (68 kg), SpO2 98 %. Physical Exam  Constitutional: She appears well-developed and well-nourished.  HENT:  Head: Normocephalic and atraumatic.  Eyes: Pupils are equal, round, and reactive to light.  Neck: Neck supple. No thyromegaly present.  Cardiovascular: Tachycardia present.  Respiratory: Effort normal and breath sounds normal.  GI: There is tenderness. There is tenderness at McBurney's point. There is no rigidity, no rebound and no guarding.    Musculoskeletal:  Legs:    Assessment/Plan Acute appendicitis. Plan: Appendectomy. Pros/cons of observational therapy discussed. Possibility of open procedure reviewed.   Robert Bellow, MD 09/22/2017, 10:15 AM

## 2017-09-22 NOTE — ED Triage Notes (Signed)
Pt here for RLQ abdominal pain that started Sunday.  No fevers or vomiting.  When stands and leans back pain is worse.  Unsure if has went through menopause, has IUD with hx tubal.  Pain worse on palpation.  Denies urinary sx.  No vaginal discharge.

## 2017-09-22 NOTE — ED Notes (Signed)
First Nurse Note:  RLQ abdominal pain since Sun.  "comes and goes" with nausea and decreased appetite.

## 2017-09-22 NOTE — Progress Notes (Signed)
Pharmacy Antibiotic Note  Rachael Fischer is a 52 y.o. female admitted on 09/22/2017 with intra-Abdominal infection.  Pharmacy has been consulted for Zosyn dosing. Acute appendidictis  Plan: Patient received Zosyn 3.375gm x 1 in ER. Will continue with Zosyn EI 3.375gm IV q8h    Height: 5\' 5"  (165.1 cm) Weight: 150 lb (68 kg) IBW/kg (Calculated) : 57  Temp (24hrs), Avg:99.1 F (37.3 C), Min:99.1 F (37.3 C), Max:99.1 F (37.3 C)  Recent Labs  Lab 09/22/17 0750  WBC 12.9*  CREATININE 0.81    Estimated Creatinine Clearance: 72.3 mL/min (by C-G formula based on SCr of 0.81 mg/dL).    No Known Allergies  Antimicrobials this admission: Zosyn 2/19 >>       >>    Dose adjustments this admission:    Microbiology results:   BCx:     UCx:      Sputum:      MRSA PCR:    Thank you for allowing pharmacy to be a part of this patient's care.  Keneshia Tena A 09/22/2017 11:01 AM

## 2017-09-22 NOTE — Anesthesia Procedure Notes (Signed)
Procedure Name: Intubation Performed by: Laurenashley Viar, CRNA Pre-anesthesia Checklist: Patient identified, Patient being monitored, Timeout performed, Emergency Drugs available and Suction available Patient Re-evaluated:Patient Re-evaluated prior to induction Oxygen Delivery Method: Circle system utilized Preoxygenation: Pre-oxygenation with 100% oxygen Induction Type: IV induction Ventilation: Mask ventilation without difficulty Laryngoscope Size: Mac and 3 Grade View: Grade I Tube type: Oral Tube size: 7.0 mm Number of attempts: 1 Airway Equipment and Method: Stylet Placement Confirmation: ETT inserted through vocal cords under direct vision,  positive ETCO2 and breath sounds checked- equal and bilateral Secured at: 21 cm Tube secured with: Tape Dental Injury: Teeth and Oropharynx as per pre-operative assessment        

## 2017-09-22 NOTE — Transfer of Care (Signed)
Immediate Anesthesia Transfer of Care Note  Patient: Rachael BatonSharon H Fischer  Procedure(s) Performed: APPENDECTOMY LAPAROSCOPIC (N/A )  Patient Location: PACU  Anesthesia Type:General  Level of Consciousness: sedated  Airway & Oxygen Therapy: Patient Spontanous Breathing and Patient connected to face mask oxygen  Post-op Assessment: Report given to RN and Post -op Vital signs reviewed and stable  Post vital signs: Reviewed and stable  Last Vitals:  Vitals:   09/22/17 1308 09/22/17 1311  BP: (!) (P) 112/46 (!) 112/46  Pulse:  76  Resp: (P) 12 14  Temp: (P) 36.6 C   SpO2:  100%    Last Pain:  Vitals:   09/22/17 1142  TempSrc: Tympanic  PainSc: 4          Complications: No apparent anesthesia complications

## 2017-09-22 NOTE — Anesthesia Postprocedure Evaluation (Signed)
Anesthesia Post Note  Patient: Rachael BatonSharon H Fischer  Procedure(s) Performed: APPENDECTOMY LAPAROSCOPIC (N/A )  Patient location during evaluation: PACU Anesthesia Type: General Level of consciousness: awake and alert and oriented Pain management: pain level controlled Vital Signs Assessment: post-procedure vital signs reviewed and stable Respiratory status: spontaneous breathing Cardiovascular status: blood pressure returned to baseline Anesthetic complications: no     Last Vitals:  Vitals:   09/22/17 1518 09/22/17 1551  BP: (!) 132/59 (!) 143/56  Pulse: 87 94  Resp: 18 16  Temp: 36.9 C (!) 36.4 C  SpO2: 97% 98%    Last Pain:  Vitals:   09/22/17 1551  TempSrc: Oral  PainSc:                  [name]

## 2017-09-22 NOTE — Anesthesia Post-op Follow-up Note (Signed)
Anesthesia QCDR form completed.        

## 2017-09-22 NOTE — ED Notes (Signed)
Patient ambulatory to room 13, Rachael Landngela RN aware of placement in room.

## 2017-09-22 NOTE — Op Note (Signed)
Preoperative diagnosis: Acute appendicitis.  Postoperative diagnosis: Same.  Operative procedure: Laparoscopic appendectomy.  Operating Surgeon: Donnalee CurryJeffrey Navraj Dreibelbis, MD.  Anesthesia: General endotracheal, Marcaine 0.5% with 1-200,000 units of epinephrine, 30 cc.  Estimated blood loss: Less than 5 cc.  Clinical note: This 52 year old woman became ill approximately 48 hours ago.  CT scan showed evidence of acute appendicitis with the appendix dilated 16 mm.  She received Zosyn in the emergency room.  She is brought to the operating for planned appendectomy.  Operative note: With the patient under adequate general endotracheal anesthesia the abdomen was cleansed with ChloraPrep and draped.  In Trendelenburg position a varies needle was placed through a trans-umbilical incision.  After assuring intra-abdominal location with a hanging drop test the abdomen was insufflated with CO2 at 10 mmHg pressure.  A 10 mm Step port was expanded.  Inspection showed no evidence of injury from initial port placement.  There was evidence of an acute inflammatory process in the right lower quadrant adherent to the anterior abdominal wall.  A 10 mm Step port was placed in the hypogastrium and a 12 mm XL port placed outside the rectus fascia in the left lower quadrant.  It was necessary to replace the hypogastric port with an 11 mm XL port to allow passage of the Endo Babcock.  The adherent omentum was taken down off the area and the area of inflammatory process adherent to the anterior abdominal wall separated without incident.  The base of the appendix was cleared.  Initial pass of the stapler suggested a disruption of the muscular layer and this was retracted.  The mesoappendix was divided with a white vascular Endo GIA load.  With the mesoappendix divided it was possible to grasp the cecum or an Endo GIA load divided the base of the appendix and provided good hemostasis.  The appendix was placed into an Endo Catch bag and  then delivered to the 12 mm port site.  After reestablishing pneumoperitoneum the right lower quadrant was irrigated with saline.  Good hemostasis was noted.  Both staple lines were intact.  The fascia at the 12 mm port site was closed with the use of a "cone" and a 0 Vicryl suture passed with a suture passer.  This provided good closure of the peritoneum and the muscle layer.  Skin incisions were closed with 4-0 Vicryl septicum suture.  Marcaine was infiltrated for postoperative analgesia.  The patient tolerated the procedure well and was taken to recovery room in stable condition.

## 2017-09-23 ENCOUNTER — Encounter: Payer: Self-pay | Admitting: General Surgery

## 2017-09-23 ENCOUNTER — Encounter: Payer: Self-pay | Admitting: *Deleted

## 2017-09-23 DIAGNOSIS — Z791 Long term (current) use of non-steroidal anti-inflammatories (NSAID): Secondary | ICD-10-CM | POA: Diagnosis not present

## 2017-09-23 DIAGNOSIS — Z975 Presence of (intrauterine) contraceptive device: Secondary | ICD-10-CM | POA: Diagnosis not present

## 2017-09-23 DIAGNOSIS — K3580 Unspecified acute appendicitis: Secondary | ICD-10-CM | POA: Diagnosis not present

## 2017-09-23 LAB — SURGICAL PATHOLOGY

## 2017-09-23 NOTE — Progress Notes (Signed)
Per MD okay for RN to advance diet.  

## 2017-09-23 NOTE — Final Progress Note (Signed)
Afebrile.  Vital signs stable.  Tolerating diet well.  Minimal discomfort at the lateral port site.  Chest exam clear.  Cardiac exam: Regular rhythm.  Abdomen, nondistended, soft, normal bowel sounds.  Mild bruising around the incision secondary to Marcaine infiltration.  Dressings dry.  Activity restrictions reviewed.  Patient may resume a regular diet on discharge.  We will arrange for follow-up examination in 1 week.  The patient reported that her daughter had a appendix removed and experienced rupture during extraction, had no antibiotics afterwards and then returned with a abscess requiring drainage.  Discussed the operative findings with a markedly swollen appendix but a good seal of the base of the cecum with the stapler.  She is aware there was no spillage at the time of the procedure and I do not anticipate a need for ongoing antibiotic therapy.  Her graph she is not encouraged to call should she experience any discomfort more than what she is appreciating at this moment.

## 2017-09-23 NOTE — Progress Notes (Signed)
Rachael RivalSharon H Fischer  A and O x 4. VSS. Pt tolerating diet well. No complaints of pain or nausea. IV removed intact, prescriptions given. Pt voiced understanding of discharge instructions with no further questions. Pt discharged with husband.      Allergies as of 09/23/2017   No Known Allergies     Medication List    TAKE these medications   etodolac 400 MG 24 hr tablet Commonly known as:  LODINE XL Take 1 tablet (400 mg total) by mouth daily.   MIRENA (52 MG) 20 MCG/24HR IUD Generic drug:  levonorgestrel 1 each by Intrauterine route once.       Vitals:   09/23/17 0016 09/23/17 0502  BP: (!) 116/57 (!) 117/58  Pulse: 84 79  Resp: 18 18  Temp:  (!) 97.4 F (36.3 C)  SpO2: 99% 97%    Rachael Fischer

## 2017-09-25 ENCOUNTER — Telehealth: Payer: Self-pay | Admitting: General Surgery

## 2017-09-25 NOTE — Telephone Encounter (Signed)
Patient called to report mild abdominal swelling persists, not associated with nausea, vomiting, inability to eat. Not back to her baseline "figure". Bowels and bladder working OK. Noted persistent redness without warms, tenderness around incisions. This was present on POD#1, and I thought this was related to marcaine w/ epi.  Stinging at these areas, no drainage. Local heat/ ice for comfort. Call if new symptoms.  F/U 10/01/17 as scheduled.

## 2017-10-01 ENCOUNTER — Encounter: Payer: Self-pay | Admitting: General Surgery

## 2017-10-01 ENCOUNTER — Ambulatory Visit (INDEPENDENT_AMBULATORY_CARE_PROVIDER_SITE_OTHER): Payer: 59 | Admitting: General Surgery

## 2017-10-01 VITALS — BP 140/80 | HR 98 | Ht 65.0 in | Wt 153.0 lb

## 2017-10-01 DIAGNOSIS — K3580 Unspecified acute appendicitis: Secondary | ICD-10-CM

## 2017-10-01 NOTE — Patient Instructions (Addendum)
The patient is aware to call back for any questions or new concerns. Resume activities as tolerated.   

## 2017-10-01 NOTE — Progress Notes (Signed)
Patient ID: Rachael Fischer, female   DOB: 18-Apr-1966, 52 y.o.   MRN: [id number]  Chief Complaint  Patient presents with  . Routine Post Op    HPI Rachael BatonSharon H Parson is a 52 y.o. female here today for her post op appendicectomy done on 09/22/2017.  The patient reports minimal residual discomfort at the lateral port site.  Skin erythema noted on her last visit and postoperatively markedly improved.  Normal bowel function. HPI  Past Medical History:  Diagnosis Date  . Plantar fasciitis     Past Surgical History:  Procedure Laterality Date  . FASCIOTOMY FOOT / TOE Left   . LAPAROSCOPIC APPENDECTOMY N/A 09/22/2017   Procedure: APPENDECTOMY LAPAROSCOPIC;  Surgeon: Earline MayotteByrnett, Vernella Niznik W, MD;  Location: [facility] ORS;  Service: General;  Laterality: N/A;    Family History  Problem Relation Age of Onset  . Diabetes Mother   . Hypertension Mother   . Hyperlipidemia Mother   . Arthritis Mother   . Hyperlipidemia Father     Social History Social History   Tobacco Use  . Smoking status: Never Smoker  . Smokeless tobacco: Never Used  Substance Use Topics  . Alcohol use: Yes    Alcohol/week: 0.0 - 0.6 oz  . Drug use: No    No Known Allergies  Current Outpatient Medications  Medication Sig Dispense Refill  . etodolac (LODINE XL) 400 MG 24 hr tablet Take 1 tablet (400 mg total) by mouth daily. 90 tablet 3  . levonorgestrel (MIRENA, 52 MG,) 20 MCG/24HR IUD 1 each by Intrauterine route once.     No current facility-administered medications for this visit.     Review of Systems Review of Systems  Constitutional: Negative.   Respiratory: Negative.   Cardiovascular: Negative.     Blood pressure 140/80, pulse 98, height 5\' 5"  (1.651 m), weight 153 lb (69.4 kg).  Physical Exam Physical Exam  Constitutional: She is oriented to person, place, and time. She appears well-developed and well-nourished.  Abdominal: Soft. Normal appearance. There is no tenderness.    Lap sites clean  Neurological:  She is alert and oriented to person, place, and time.  Skin: Skin is warm and dry.  Psychiatric: Her behavior is normal.       Assessment    Doing well post appendectomy.    Plan  Patient to return as needed. Resume activities as tolerated. The patient is aware to call back for any questions or new concerns.   HPI, Physical Exam, Assessment and Plan have been scribed under the direction and in the presence of Earline MayotteJeffrey W. Cherice Glennie, MD. Dorathy DaftMarsha Hatch, RN  I have completed the exam and reviewed the above documentation for accuracy and completeness.  I agree with the above.  [employer] has been used and any errors in dictation or transcription are unintentional.  Donnalee CurryJeffrey Georga Stys, M.D., F.A.C.S.  Merrily PewJeffrey W Fredna Stricker 10/02/2017, 7:57 PM

## 2018-07-30 DIAGNOSIS — H524 Presbyopia: Secondary | ICD-10-CM | POA: Diagnosis not present

## 2018-08-15 IMAGING — CT CT ABD-PELV W/ CM
2 of 5 series · 16 of 46 positions shown, 18 images · IV contrast (APPLIED)
Comparison: None.

CLINICAL DATA: Right lower quadrant abdominal pain beginning 2 days
ago.

EXAM:
CT ABDOMEN AND PELVIS WITH CONTRAST
TECHNIQUE: Multidetector CT imaging of the abdomen and pelvis was performed
using the standard protocol following bolus administration of
intravenous contrast.
CONTRAST:  100mL [id number] IOPAMIDOL ([id number]) INJECTION 61%

[Series 2: routine abd/pel with · axial · 0.72mm/px · z∈[-562,-137]mm · 13 of 97 slices shown, 15 images]
[im 6/97  soft-tissue]
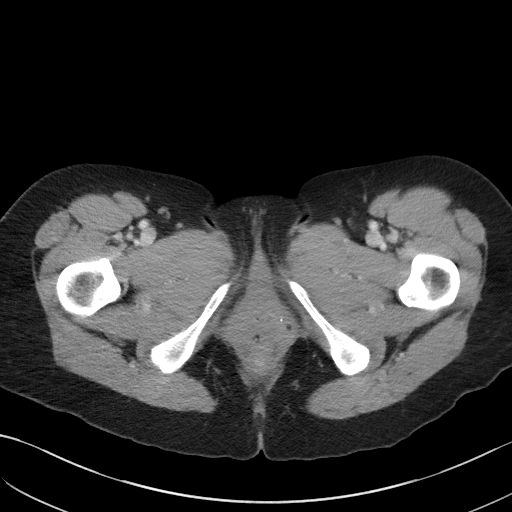
[im 6/97  bone]
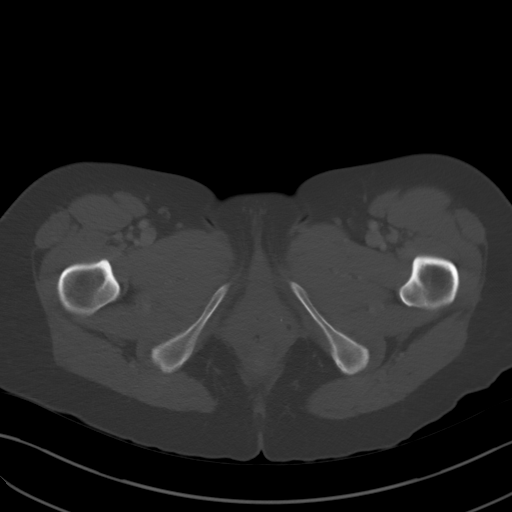
[im 16/97  soft-tissue]
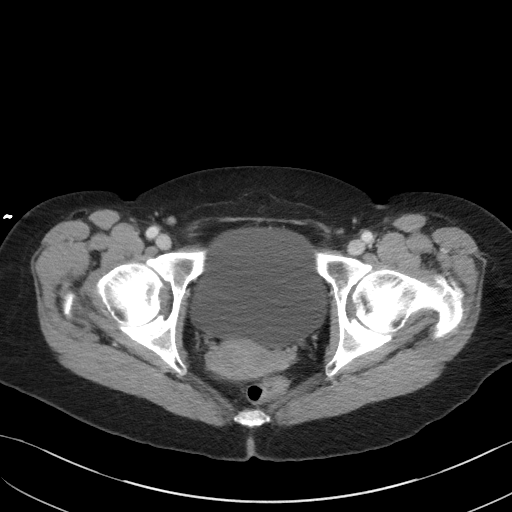
[im 21/97  soft-tissue]
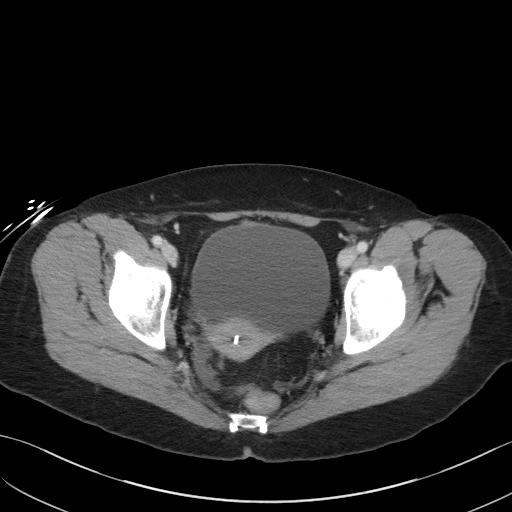
[im 26/97  soft-tissue]
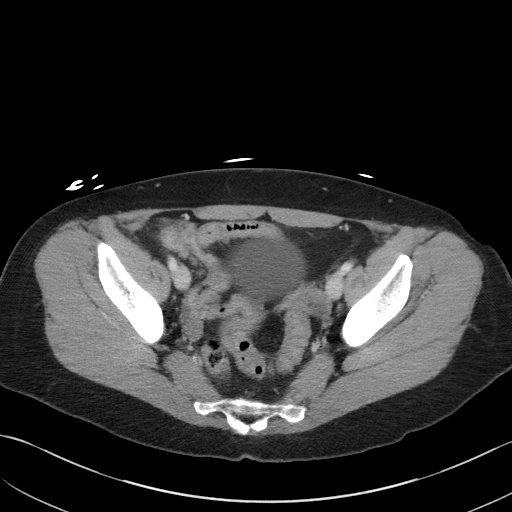
[im 36/97  soft-tissue]
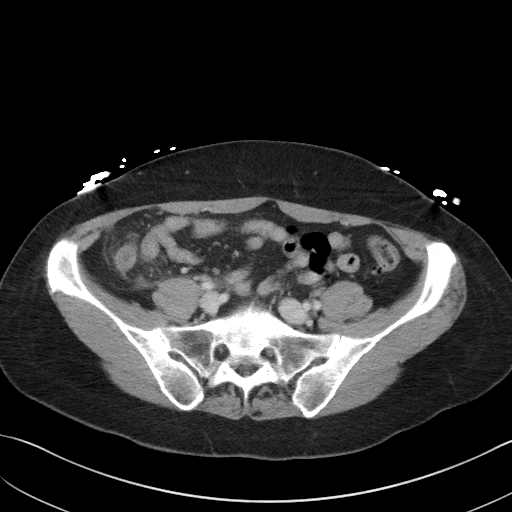
[im 41/97  soft-tissue]
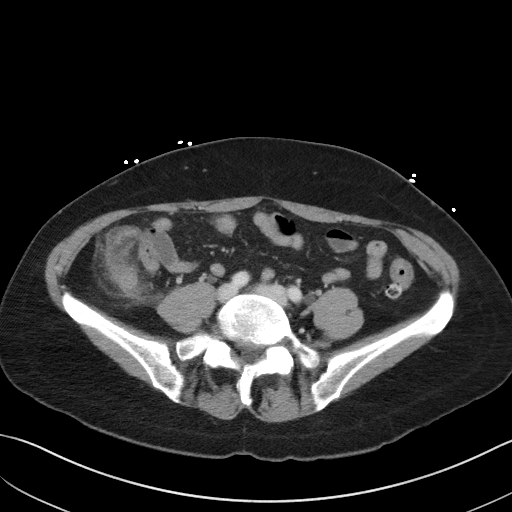
[im 51/97  soft-tissue]
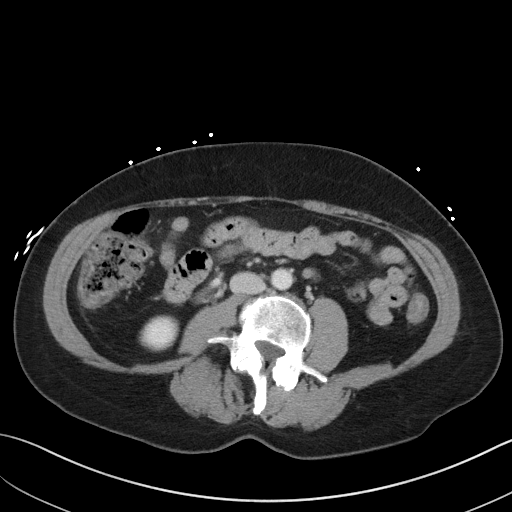
[im 56/97  soft-tissue]
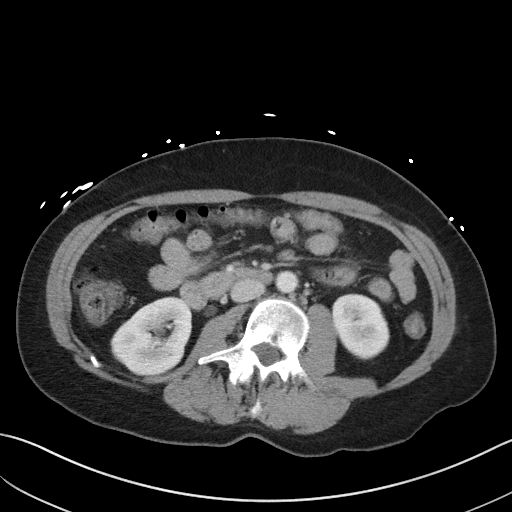
[im 61/97  soft-tissue]
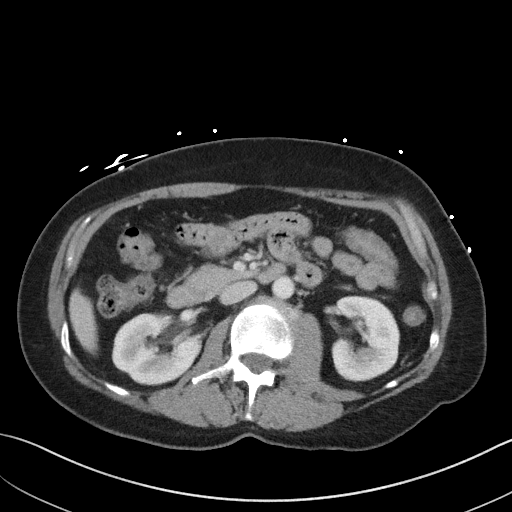
[im 61/97  bone]
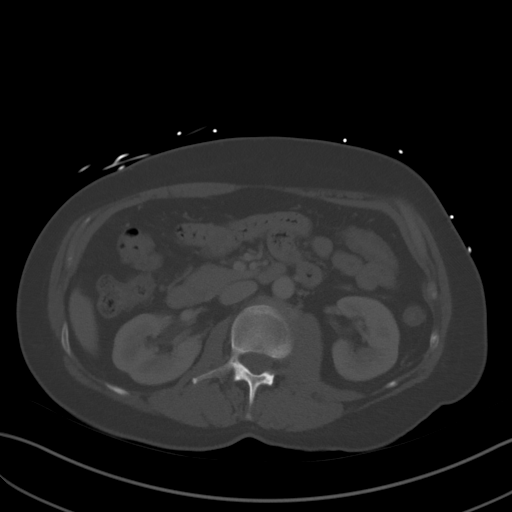
[im 71/97  soft-tissue]
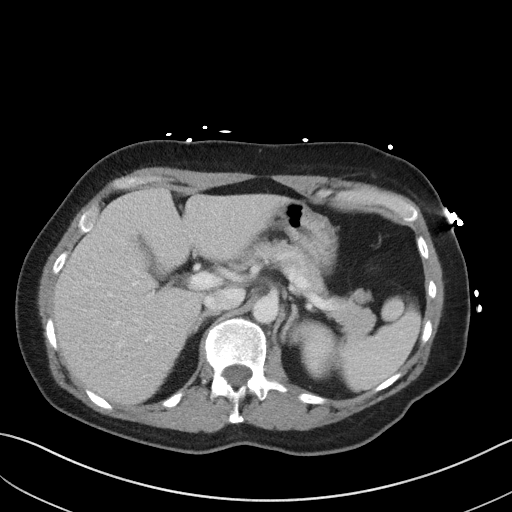
[im 76/97  soft-tissue]
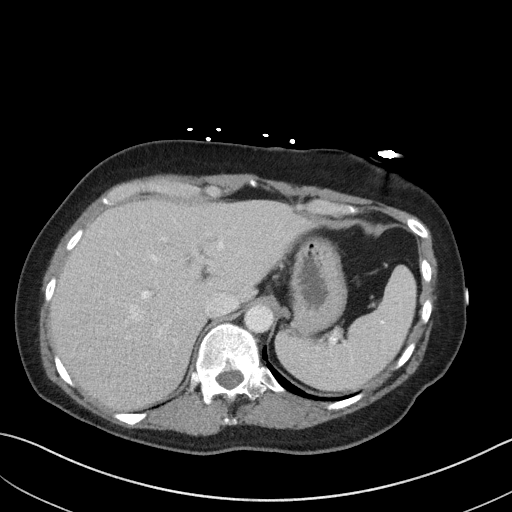
[im 81/97  soft-tissue]
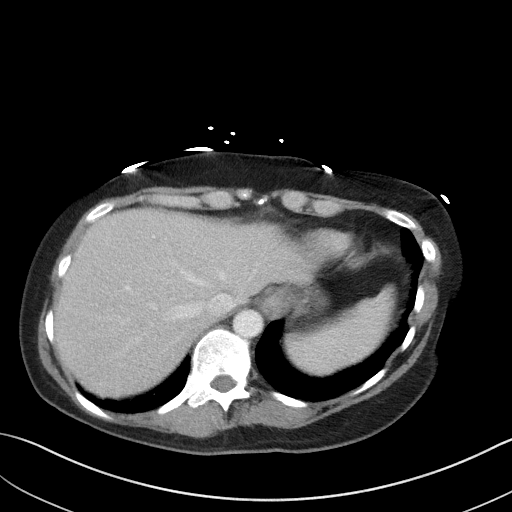
[im 91/97  soft-tissue]
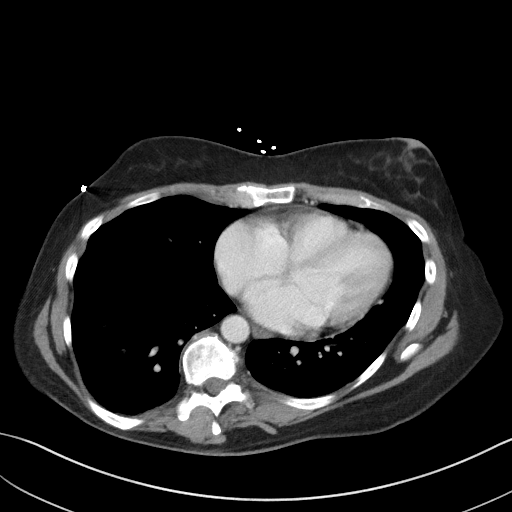

[Series 5: coronal st · coronal · 0.85mm/px · 3 of 81 slices shown]
[im 27/81  soft-tissue]
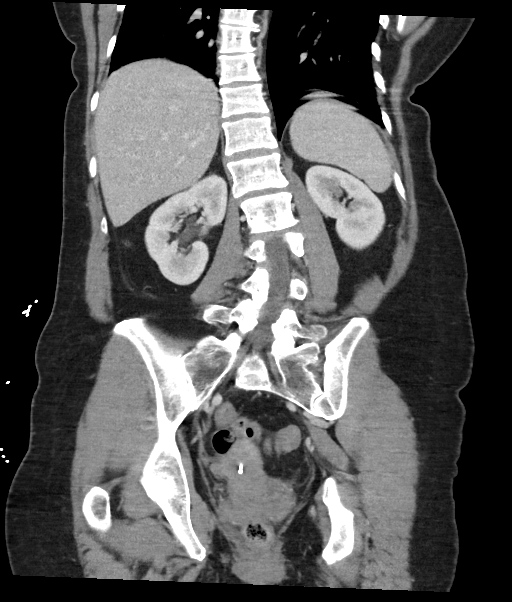
[im 36/81  soft-tissue]
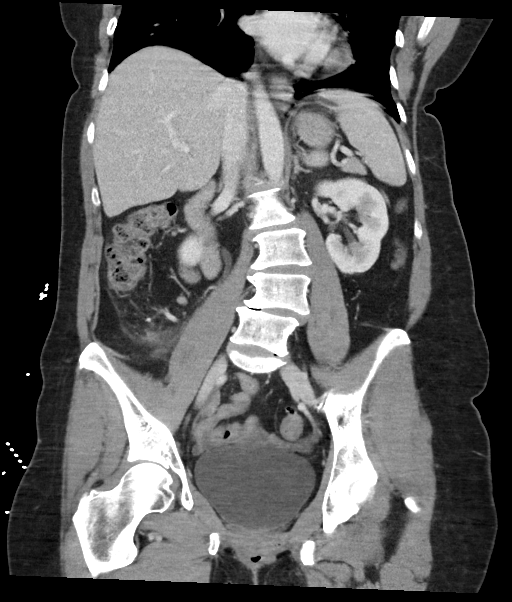
[im 45/81  soft-tissue]
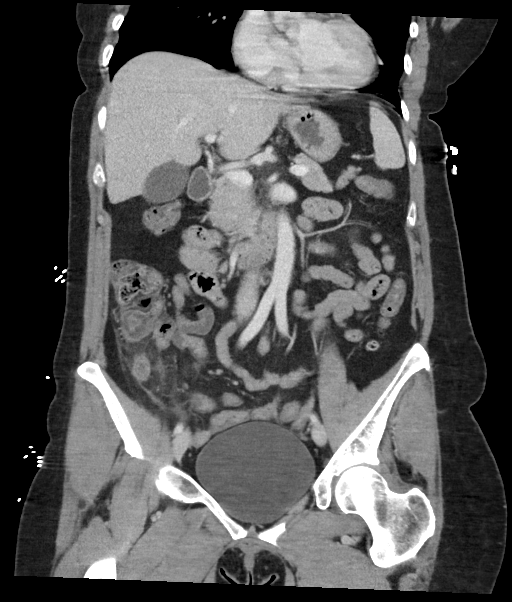

[16 of 46 positions shown; findings below may reference images not displayed]

FINDINGS: Lower chest: Normal except for spinal curvature and some chest
asymmetry secondary to that.

Hepatobiliary: Liver parenchyma is normal except for a 12 mm simple
cyst in the ventral left lobe of the liver and a 3 mm simple cyst in
the central portion of the right lobe. No calcified gallstones.

Pancreas: Normal

Spleen: Normal

Adrenals/Urinary Tract: Adrenal glands are normal. Kidneys are
normal. No cyst, mass, stone or hydronephrosis. Bladder is normal.

Stomach/Bowel: There is acute appendicitis. See below. Remainder of
the intestine is normal.

Appendix: Location: Right iliac fossa, extending straight caudal
from the cecum.

Diameter: 16 mm

Appendicolith: None seen

Mucosal hyper-enhancement: Moderate

Extraluminal gas: None

Periappendiceal collection: Generalized fat stranding without
collection.

Vascular/Lymphatic: Normal

Reproductive: IUD present within a normal appearing uterus. No
ovarian lesion.

Other: No free fluid or air.

Musculoskeletal: Thoracic curvature convex to the right and
thoracolumbar curvature convex to the left. No acute bone finding.
IMPRESSION: Acute unruptured appendicitis. Edematous inflammatory changes
surrounding the appendix but no evidence of abscess.

## 2019-05-16 ENCOUNTER — Other Ambulatory Visit: Payer: Self-pay

## 2019-05-16 DIAGNOSIS — Z20828 Contact with and (suspected) exposure to other viral communicable diseases: Secondary | ICD-10-CM | POA: Diagnosis not present

## 2019-05-16 DIAGNOSIS — Z20822 Contact with and (suspected) exposure to covid-19: Secondary | ICD-10-CM

## 2019-05-17 LAB — NOVEL CORONAVIRUS, NAA: SARS-CoV-2, NAA: NOT DETECTED

## 2019-06-21 DIAGNOSIS — H524 Presbyopia: Secondary | ICD-10-CM | POA: Diagnosis not present

## 2020-07-10 ENCOUNTER — Institutional Professional Consult (permissible substitution): Payer: 59 | Admitting: Plastic Surgery

## 2020-08-05 ENCOUNTER — Telehealth: Payer: 59 | Admitting: Family

## 2020-08-05 DIAGNOSIS — J069 Acute upper respiratory infection, unspecified: Secondary | ICD-10-CM | POA: Diagnosis not present

## 2020-08-05 DIAGNOSIS — Z20828 Contact with and (suspected) exposure to other viral communicable diseases: Secondary | ICD-10-CM | POA: Diagnosis not present

## 2020-08-05 MED ORDER — BENZONATATE 100 MG PO CAPS: 100.0000 mg | ORAL_CAPSULE | Freq: Three times a day (TID) | ORAL | 0 refills | Status: DC | PRN

## 2020-08-05 MED ORDER — FLUTICASONE PROPIONATE 50 MCG/ACT NA SUSP
2.0000 | Freq: Every day | NASAL | 6 refills | Status: DC
Start: 2020-08-05 — End: 2020-12-10

## 2020-08-05 NOTE — Progress Notes (Signed)

## 2020-08-07 ENCOUNTER — Telehealth: Payer: 59 | Admitting: Emergency Medicine

## 2020-08-07 ENCOUNTER — Institutional Professional Consult (permissible substitution): Payer: 59 | Admitting: Plastic Surgery

## 2020-08-07 ENCOUNTER — Other Ambulatory Visit: Payer: Self-pay | Admitting: Emergency Medicine

## 2020-08-07 DIAGNOSIS — J329 Chronic sinusitis, unspecified: Secondary | ICD-10-CM | POA: Diagnosis not present

## 2020-08-07 MED ORDER — AMOXICILLIN-POT CLAVULANATE 875-125 MG PO TABS: 1.0000 | ORAL_TABLET | Freq: Two times a day (BID) | ORAL | 0 refills | Status: DC

## 2020-08-07 NOTE — Progress Notes (Signed)

## 2020-08-10 ENCOUNTER — Institutional Professional Consult (permissible substitution): Payer: 59 | Admitting: Plastic Surgery

## 2020-08-13 ENCOUNTER — Other Ambulatory Visit: Payer: Self-pay | Admitting: Obstetrics and Gynecology

## 2020-08-13 DIAGNOSIS — Z6826 Body mass index (BMI) 26.0-26.9, adult: Secondary | ICD-10-CM | POA: Diagnosis not present

## 2020-08-13 DIAGNOSIS — Z01419 Encounter for gynecological examination (general) (routine) without abnormal findings: Secondary | ICD-10-CM | POA: Diagnosis not present

## 2020-08-13 DIAGNOSIS — Z30432 Encounter for removal of intrauterine contraceptive device: Secondary | ICD-10-CM | POA: Diagnosis not present

## 2020-08-13 DIAGNOSIS — N819 Female genital prolapse, unspecified: Secondary | ICD-10-CM | POA: Diagnosis not present

## 2020-08-13 DIAGNOSIS — N952 Postmenopausal atrophic vaginitis: Secondary | ICD-10-CM | POA: Diagnosis not present

## 2020-08-13 DIAGNOSIS — Z1231 Encounter for screening mammogram for malignant neoplasm of breast: Secondary | ICD-10-CM | POA: Diagnosis not present

## 2020-08-13 DIAGNOSIS — I10 Essential (primary) hypertension: Secondary | ICD-10-CM | POA: Diagnosis not present

## 2020-08-13 DIAGNOSIS — N8110 Cystocele, unspecified: Secondary | ICD-10-CM | POA: Diagnosis not present

## 2020-08-13 LAB — HM PAP SMEAR: HM Pap smear: NEGATIVE

## 2020-08-29 ENCOUNTER — Encounter: Payer: Self-pay | Admitting: *Deleted

## 2020-09-04 ENCOUNTER — Encounter: Payer: Self-pay | Admitting: Plastic Surgery

## 2020-09-04 ENCOUNTER — Other Ambulatory Visit: Payer: Self-pay

## 2020-09-04 ENCOUNTER — Ambulatory Visit (INDEPENDENT_AMBULATORY_CARE_PROVIDER_SITE_OTHER): Payer: Self-pay | Admitting: Plastic Surgery

## 2020-09-04 VITALS — BP 150/67 | HR 89

## 2020-09-04 DIAGNOSIS — Z719 Counseling, unspecified: Secondary | ICD-10-CM

## 2020-09-04 NOTE — Progress Notes (Signed)
Botulinum Toxin Procedure Note  Procedure: Cosmetic botulinum toxin   Pre-operative Diagnosis: Dynamic rhytides   Post-operative Diagnosis: Same  Complications:  None  Brief history: The patient desires botulinum toxin injection of her forehead. I discussed with the patient this proposed procedure of botulinum toxin injections, which is customized depending on the particular needs of the patient. It is performed on facial rhytids as a temporary correction. The alternatives were discussed with the patient. The risks were addressed including bleeding, scarring, infection, damage to deeper structures, asymmetry, and chronic pain, which may occur infrequently after a procedure. The individual's choice to undergo a surgical procedure is based on the comparison of risks to potential benefits. Other risks include unsatisfactory results, brow ptosis, eyelid ptosis, allergic reaction, temporary paralysis, which should go away with time, bruising, blurring disturbances and delayed healing. Botulinum toxin injections do not arrest the aging process or produce permanent tightening of the eyelid.  Operative intervention maybe necessary to maintain the results of a blepharoplasty or botulinum toxin. The patient understands and wishes to proceed.  Procedure: The area was prepped with alcohol and dried with a clean gauze. Using a clean technique, the botulinum toxin was diluted with 1.25 cc of preservative-free normal saline which was slowly injected with an 18 gauge needle in a tuberculin syringes.  A 32 gauge needles were then used to inject the botulinum toxin. This mixture allow for an aliquot of 5 units per 0.1 cc in each injection site.    Subsequently the mixture was injected in the glabellar and forehead area with preservation of the temporal branch to the lateral eyebrow as well as into each lateral canthal area beginning from the lateral orbital rim medial to the zygomaticus major in 3 separate areas. A  total of 30 Units of botulinum toxin was used. The forehead and glabellar area was injected with care to inject intramuscular only while holding pressure on the supratrochlear vessels in each area during each injection on either side of the medial corrugators. The injection proceeded vertically superiorly to the medial 2/3 of the frontalis muscle and superior 2/3 of the lateral frontalis, again with preservation of the frontal branch.  No complications were noted. Light pressure was held for 5 minutes. She was instructed explicitly in post-operative care.  Botox LOT:  [id number] C4 EXP:  4/24  

## 2020-09-28 DIAGNOSIS — Z13820 Encounter for screening for osteoporosis: Secondary | ICD-10-CM | POA: Diagnosis not present

## 2020-09-28 LAB — HM DEXA SCAN

## 2020-10-08 ENCOUNTER — Institutional Professional Consult (permissible substitution): Payer: Self-pay | Admitting: Plastic Surgery

## 2020-10-16 ENCOUNTER — Ambulatory Visit: Payer: Self-pay | Admitting: Internal Medicine

## 2020-10-19 DIAGNOSIS — H524 Presbyopia: Secondary | ICD-10-CM | POA: Diagnosis not present

## 2020-10-22 ENCOUNTER — Other Ambulatory Visit: Payer: Self-pay

## 2020-10-22 ENCOUNTER — Encounter: Payer: Self-pay | Admitting: Internal Medicine

## 2020-10-22 ENCOUNTER — Ambulatory Visit: Payer: 59 | Admitting: Internal Medicine

## 2020-10-22 ENCOUNTER — Other Ambulatory Visit: Payer: Self-pay | Admitting: Internal Medicine

## 2020-10-22 VITALS — BP 158/88 | HR 90 | Temp 98.0°F | Ht 65.0 in | Wt 155.0 lb

## 2020-10-22 DIAGNOSIS — M8580 Other specified disorders of bone density and structure, unspecified site: Secondary | ICD-10-CM | POA: Diagnosis not present

## 2020-10-22 DIAGNOSIS — E782 Mixed hyperlipidemia: Secondary | ICD-10-CM | POA: Diagnosis not present

## 2020-10-22 DIAGNOSIS — Z1159 Encounter for screening for other viral diseases: Secondary | ICD-10-CM | POA: Diagnosis not present

## 2020-10-22 DIAGNOSIS — F411 Generalized anxiety disorder: Secondary | ICD-10-CM | POA: Diagnosis not present

## 2020-10-22 DIAGNOSIS — I10 Essential (primary) hypertension: Secondary | ICD-10-CM | POA: Diagnosis not present

## 2020-10-22 MED ORDER — ESCITALOPRAM OXALATE 10 MG PO TABS: 10.0000 mg | ORAL_TABLET | Freq: Every day | ORAL | 1 refills | Status: DC

## 2020-10-22 MED ORDER — OLMESARTAN MEDOXOMIL 20 MG PO TABS: 20.0000 mg | ORAL_TABLET | Freq: Every day | ORAL | 1 refills | Status: DC

## 2020-10-22 NOTE — Progress Notes (Signed)
Date:  10/22/2020   Name:  Rachael Fischer   DOB:  29-Nov-1965   MRN:  [id number]   Chief Complaint: Establish Care, Hypertension, and Anxiety  Anxiety Presents for initial visit. Episode onset: her whole life. The problem has been gradually worsening. Symptoms include excessive worry, irritability, nervous/anxious behavior and restlessness. Patient reports no chest pain, confusion, palpitations, shortness of breath or suicidal ideas. Symptoms occur most days. The severity of symptoms is moderate. The symptoms are aggravated by family issues and work stress. The quality of sleep is good.   Past treatments include SSRIs (may have taken lexapro in the past).  Hypertension This is a new problem. The current episode started more than 1 month ago. The problem has been gradually worsening since onset. Associated symptoms include anxiety. Pertinent negatives include no chest pain, headaches, palpitations, peripheral edema or shortness of breath. There are no associated agents to hypertension. Past treatments include nothing. There is no history of kidney disease, CAD/MI or CVA.    Lab Results  Component Value Date   CREATININE 0.81 09/22/2017   BUN 14 09/22/2017   NA 140 09/22/2017   K 3.6 09/22/2017   CL 103 09/22/2017   CO2 27 09/22/2017   Lab Results  Component Value Date   CHOL 187 09/04/2016   HDL 60.30 09/04/2016   LDLCALC 115 (H) 09/04/2016   TRIG 58.0 09/04/2016   CHOLHDL 3 09/04/2016   No results found for: TSH Lab Results  Component Value Date   HGBA1C 5.5 09/04/2016   Lab Results  Component Value Date   WBC 12.9 (H) 09/22/2017   HGB 15.2 09/22/2017   HCT 44.6 09/22/2017   MCV 89.5 09/22/2017   PLT 297 09/22/2017   Lab Results  Component Value Date   ALT 18 09/22/2017   AST 27 09/22/2017   ALKPHOS 94 09/22/2017   BILITOT 1.2 09/22/2017     Review of Systems  Constitutional: Positive for irritability. Negative for chills, fatigue, fever and unexpected weight  change.  HENT: Negative for trouble swallowing.   Respiratory: Negative for chest tightness and shortness of breath.   Cardiovascular: Negative for chest pain, palpitations and leg swelling.  Neurological: Negative for light-headedness and headaches.  Psychiatric/Behavioral: Negative for confusion, dysphoric mood and suicidal ideas. The patient is nervous/anxious.     Patient Active Problem List   Diagnosis Date Noted  . Essential hypertension 10/22/2020  . Generalized anxiety disorder 10/22/2020  . Osteopenia determined by x-ray 10/22/2020  . Hyperlipemia, mixed 10/22/2020    No Known Allergies  Past Surgical History:  Procedure Laterality Date  . FASCIOTOMY FOOT / TOE Left   . LAPAROSCOPIC APPENDECTOMY N/A 09/22/2017   Procedure: APPENDECTOMY LAPAROSCOPIC;  Surgeon: Earline Mayotte, MD;  Location: [facility] ORS;  Service: General;  Laterality: N/A;    Social History   Tobacco Use  . Smoking status: Never Smoker  . Smokeless tobacco: Never Used  Vaping Use  . Vaping Use: Never used  Substance Use Topics  . Alcohol use: Yes    Alcohol/week: 1.0 - 2.0 standard drink    Types: 1 Glasses of wine per week    Comment: 1 glass a month   . Drug use: No     Medication list has been reviewed and updated.  Current Meds  Medication Sig  . CALCIUM PO Take 1,200 mg by mouth.  . escitalopram (LEXAPRO) 10 MG tablet Take 1 tablet (10 mg total) by mouth daily.  . fluticasone (FLONASE) 50  MCG/ACT nasal spray Place 2 sprays into both nostrils daily.  Marland Kitchen olmesartan (BENICAR) 20 MG tablet Take 1 tablet (20 mg total) by mouth daily.  Marland Kitchen PREMARIN vaginal cream Place vaginally.  Marland Kitchen VITAMIN D PO Take 2,000 Units by mouth daily.    PHQ 2/9 Scores 10/22/2020  PHQ - 2 Score 0  PHQ- 9 Score 0    GAD 7 : Generalized Anxiety Score 10/22/2020  Nervous, Anxious, on Edge 1  Control/stop worrying 1  Worry too much - different things 1  Trouble relaxing 3  Restless 0  Easily annoyed or irritable  2  Afraid - awful might happen 1  Total GAD 7 Score 9    BP Readings from Last 3 Encounters:  10/22/20 (!) 158/88  09/04/20 (!) 150/67  10/01/17 140/80    Physical Exam Vitals and nursing note reviewed.  Constitutional:      General: She is not in acute distress.    Appearance: Normal appearance. She is well-developed.  HENT:     Head: Normocephalic and atraumatic.  Neck:     Vascular: No carotid bruit.  Cardiovascular:     Rate and Rhythm: Normal rate and regular rhythm.     Pulses: Normal pulses.     Heart sounds: No murmur heard.   Pulmonary:     Effort: Pulmonary effort is normal. No respiratory distress.     Breath sounds: No wheezing or rhonchi.  Musculoskeletal:     Cervical back: Normal range of motion.     Right lower leg: No edema.     Left lower leg: No edema.  Lymphadenopathy:     Cervical: No cervical adenopathy.  Skin:    General: Skin is warm and dry.     Capillary Refill: Capillary refill takes less than 2 seconds.     Findings: No rash.  Neurological:     General: No focal deficit present.     Mental Status: She is alert and oriented to person, place, and time.  Psychiatric:        Attention and Perception: Attention normal.        Mood and Affect: Mood is anxious.        Speech: Speech normal.        Behavior: Behavior normal.        Thought Content: Thought content does not include suicidal ideation. Thought content does not include suicidal plan.        Cognition and Memory: Cognition normal.     Wt Readings from Last 3 Encounters:  10/22/20 155 lb (70.3 kg)  10/01/17 153 lb (69.4 kg)  09/22/17 150 lb (68 kg)    BP (!) 158/88   Pulse 90   Temp 98 F (36.7 C) (Oral)   Ht 5\' 5"  (1.651 m)   Wt 155 lb (70.3 kg)   SpO2 97%   BMI 25.79 kg/m   Assessment and Plan: 1. Essential hypertension New onset - need to begin medication Begin planned regular exercise; limit sodium - CBC with Differential/Platelet - Comprehensive metabolic  panel - TSH - olmesartan (BENICAR) 20 MG tablet; Take 1 tablet (20 mg total) by mouth daily.  Dispense: 90 tablet; Refill: 1  2. Generalized anxiety disorder Causing some daily discomfort so will begin medication - escitalopram (LEXAPRO) 10 MG tablet; Take 1 tablet (10 mg total) by mouth daily.  Dispense: 90 tablet; Refill: 1  3. Osteopenia determined by x-ray Now on Calcium and vitamin D  4. Need for hepatitis C screening  test - Hepatitis C antibody  5. Hyperlipemia, mixed Never treated with medication - Lipid panel  Follow up in 6 weeks; sooner if needed Partially dictated using Dragon software. Any errors are unintentional.  Bari Edward, MD [facility]  10/22/2020

## 2020-10-23 ENCOUNTER — Encounter: Payer: Self-pay | Admitting: Internal Medicine

## 2020-10-23 LAB — CBC WITH DIFFERENTIAL/PLATELET
Basophils Absolute: 0.1 10*3/uL (ref 0.0–0.2)
Basos: 1 %
EOS (ABSOLUTE): 0.1 10*3/uL (ref 0.0–0.4)
Eos: 2 %
Hematocrit: 41.1 % (ref 34.0–46.6)
Hemoglobin: 14 g/dL (ref 11.1–15.9)
Immature Grans (Abs): 0 10*3/uL (ref 0.0–0.1)
Immature Granulocytes: 0 %
Lymphocytes Absolute: 1.8 10*3/uL (ref 0.7–3.1)
Lymphs: 22 %
MCH: 30.2 pg (ref 26.6–33.0)
MCHC: 34.1 g/dL (ref 31.5–35.7)
MCV: 89 fL (ref 79–97)
Monocytes Absolute: 0.6 10*3/uL (ref 0.1–0.9)
Monocytes: 7 %
Neutrophils Absolute: 5.5 10*3/uL (ref 1.4–7.0)
Neutrophils: 68 %
Platelets: 354 10*3/uL (ref 150–450)
RBC: 4.63 x10E6/uL (ref 3.77–5.28)
RDW: 12.4 % (ref 11.7–15.4)
WBC: 8 10*3/uL (ref 3.4–10.8)

## 2020-10-23 LAB — COMPREHENSIVE METABOLIC PANEL
ALT: 22 IU/L (ref 0–32)
AST: 27 IU/L (ref 0–40)
Albumin/Globulin Ratio: 1.8 (ref 1.2–2.2)
Albumin: 4.6 g/dL (ref 3.8–4.9)
Alkaline Phosphatase: 82 IU/L (ref 44–121)
BUN/Creatinine Ratio: 23 (ref 9–23)
BUN: 18 mg/dL (ref 6–24)
Bilirubin Total: 0.5 mg/dL (ref 0.0–1.2)
CO2: 24 mmol/L (ref 20–29)
Calcium: 9.9 mg/dL (ref 8.7–10.2)
Chloride: 100 mmol/L (ref 96–106)
Creatinine, Ser: 0.78 mg/dL (ref 0.57–1.00)
Globulin, Total: 2.6 g/dL (ref 1.5–4.5)
Glucose: 74 mg/dL (ref 65–99)
Potassium: 4 mmol/L (ref 3.5–5.2)
Sodium: 141 mmol/L (ref 134–144)
Total Protein: 7.2 g/dL (ref 6.0–8.5)
eGFR: 89 mL/min/{1.73_m2} (ref >59)

## 2020-10-23 LAB — LIPID PANEL
Chol/HDL Ratio: 3.3 ratio (ref 0.0–4.4)
Cholesterol, Total: 202 mg/dL — ABNORMAL HIGH (ref 100–199)
HDL: 61 mg/dL (ref >39)
LDL Chol Calc (NIH): 127 mg/dL — ABNORMAL HIGH (ref 0–99)
Triglycerides: 76 mg/dL (ref 0–149)
VLDL Cholesterol Cal: 14 mg/dL (ref 5–40)

## 2020-10-23 LAB — TSH: TSH: 2.01 u[IU]/mL (ref 0.450–4.500)

## 2020-10-23 LAB — HEPATITIS C ANTIBODY: Hep C Virus Ab: <0.1 s/co ratio (ref 0.0–0.9)

## 2020-10-24 ENCOUNTER — Encounter: Payer: Self-pay | Admitting: Internal Medicine

## 2020-11-05 ENCOUNTER — Other Ambulatory Visit: Payer: Self-pay

## 2020-11-06 ENCOUNTER — Other Ambulatory Visit: Payer: Self-pay

## 2020-11-06 MED ORDER — ZOSTER VAC RECOMB ADJUVANTED 50 MCG/0.5ML IM SUSR
0.5000 mL | Freq: Once | INTRAMUSCULAR | 0 refills | Status: DC
  Filled 2020-11-15: qty 0.5, 1d supply, fill #0

## 2020-11-13 ENCOUNTER — Other Ambulatory Visit: Payer: Self-pay

## 2020-11-13 MED FILL — Estrogens, Conjugated Vaginal Cream 0.625 MG/GM: VAGINAL | 30 days supply | Qty: 30 | Fill #0 | Status: AC

## 2020-11-15 ENCOUNTER — Other Ambulatory Visit: Payer: Self-pay

## 2020-11-20 ENCOUNTER — Institutional Professional Consult (permissible substitution): Payer: 59 | Admitting: Plastic Surgery

## 2020-11-27 ENCOUNTER — Encounter: Payer: Self-pay | Admitting: Plastic Surgery

## 2020-11-27 ENCOUNTER — Ambulatory Visit (INDEPENDENT_AMBULATORY_CARE_PROVIDER_SITE_OTHER): Payer: Self-pay | Admitting: Plastic Surgery

## 2020-11-27 ENCOUNTER — Other Ambulatory Visit: Payer: Self-pay

## 2020-11-27 VITALS — BP 135/70

## 2020-11-27 DIAGNOSIS — Z719 Counseling, unspecified: Secondary | ICD-10-CM

## 2020-11-27 NOTE — Progress Notes (Signed)
Botulinum Toxin Procedure Note  Procedure: Cosmetic botulinum toxin   Pre-operative Diagnosis: Dynamic rhytides   Post-operative Diagnosis: Same  Complications:  None  Brief history: The patient desires botulinum toxin injection of her forehead. I discussed with the patient this proposed procedure of botulinum toxin injections, which is customized depending on the particular needs of the patient. It is performed on facial rhytids as a temporary correction. The alternatives were discussed with the patient. The risks were addressed including bleeding, scarring, infection, damage to deeper structures, asymmetry, and chronic pain, which may occur infrequently after a procedure. The individual's choice to undergo a surgical procedure is based on the comparison of risks to potential benefits. Other risks include unsatisfactory results, brow ptosis, eyelid ptosis, allergic reaction, temporary paralysis, which should go away with time, bruising, blurring disturbances and delayed healing. Botulinum toxin injections do not arrest the aging process or produce permanent tightening of the eyelid.  Operative intervention maybe necessary to maintain the results of a blepharoplasty or botulinum toxin. The patient understands and wishes to proceed.  Procedure: The area was prepped with alcohol and dried with a clean gauze. Using a clean technique, the botulinum toxin was diluted with 1.25 cc of preservative-free normal saline which was slowly injected with an 18 gauge needle in a tuberculin syringes.  A 32 gauge needles were then used to inject the botulinum toxin. This mixture allow for an aliquot of 5 units per 0.1 cc in each injection site.    Subsequently the mixture was injected in the glabellar and forehead area with preservation of the temporal branch to the lateral eyebrow as well as into each lateral canthal area beginning from the lateral orbital rim medial to the zygomaticus major in 3 separate areas. A  total of 30 Units of botulinum toxin was used. The forehead and glabellar area was injected with care to inject intramuscular only while holding pressure on the supratrochlear vessels in each area during each injection on either side of the medial corrugators. The injection proceeded vertically superiorly to the medial 2/3 of the frontalis muscle and superior 2/3 of the lateral frontalis, again with preservation of the frontal branch.  No complications were noted. Light pressure was held for 5 minutes. She was instructed explicitly in post-operative care.  Botox LOT:  [id number] C4 EXP:  6/24   Also discussed treatment for her hyperpigmentation and some of the fine lines and wrinkles IPL and halo.  She also has got a start on a ZO product.

## 2020-12-06 ENCOUNTER — Ambulatory Visit: Payer: 59 | Admitting: Internal Medicine

## 2020-12-10 ENCOUNTER — Ambulatory Visit: Payer: 59 | Admitting: Internal Medicine

## 2020-12-10 ENCOUNTER — Other Ambulatory Visit: Payer: Self-pay

## 2020-12-10 ENCOUNTER — Encounter: Payer: Self-pay | Admitting: Internal Medicine

## 2020-12-10 VITALS — BP 116/66 | HR 86 | Temp 98.0°F | Ht 65.0 in | Wt 156.0 lb

## 2020-12-10 DIAGNOSIS — I10 Essential (primary) hypertension: Secondary | ICD-10-CM

## 2020-12-10 DIAGNOSIS — F411 Generalized anxiety disorder: Secondary | ICD-10-CM

## 2020-12-10 NOTE — Progress Notes (Signed)
Date:  12/10/2020   Name:  Rachael Fischer   DOB:  [birth year]   MRN:  [id number]   Chief Complaint: Hypertension (Bp has been good since on new bp medication ) and Anxiety  Hypertension This is a new problem. The current episode started more than 1 month ago. The problem has been gradually improving since onset. The problem is controlled (at work now in the range 120/70). Associated symptoms include anxiety. Pertinent negatives include no chest pain, headaches, palpitations or shortness of breath. There are no known risk factors for coronary artery disease. Past treatments include angiotensin blockers. The current treatment provides significant improvement. There are no compliance problems.  There is no history of kidney disease.  Anxiety Presents for follow-up visit. Patient reports no chest pain, dizziness, nervous/anxious behavior, palpitations or shortness of breath. Symptoms occur rarely. The severity of symptoms is mild. The quality of sleep is good.   Compliance with medications is 76-100% (started lexapro last visit).    Lab Results  Component Value Date   CREATININE 0.78 10/22/2020   BUN 18 10/22/2020   NA 141 10/22/2020   K 4.0 10/22/2020   CL 100 10/22/2020   CO2 24 10/22/2020   Lab Results  Component Value Date   CHOL 202 (H) 10/22/2020   HDL 61 10/22/2020   LDLCALC 127 (H) 10/22/2020   TRIG 76 10/22/2020   CHOLHDL 3.3 10/22/2020   Lab Results  Component Value Date   TSH 2.010 10/22/2020   Lab Results  Component Value Date   HGBA1C 5.5 09/04/2016   Lab Results  Component Value Date   WBC 8.0 10/22/2020   HGB 14.0 10/22/2020   HCT 41.1 10/22/2020   MCV 89 10/22/2020   PLT 354 10/22/2020   Lab Results  Component Value Date   ALT 22 10/22/2020   AST 27 10/22/2020   ALKPHOS 82 10/22/2020   BILITOT 0.5 10/22/2020     Review of Systems  Constitutional: Negative for fatigue and unexpected weight change.  HENT: Negative for nosebleeds.   Eyes: Negative  for visual disturbance.  Respiratory: Negative for cough, chest tightness, shortness of breath and wheezing.   Cardiovascular: Negative for chest pain, palpitations and leg swelling.  Gastrointestinal: Negative for abdominal pain, constipation and diarrhea.  Musculoskeletal: Negative for arthralgias.  Neurological: Negative for dizziness, weakness, light-headedness and headaches.  Psychiatric/Behavioral: Negative for dysphoric mood and sleep disturbance. The patient is not nervous/anxious.     Patient Active Problem List   Diagnosis Date Noted  . Essential hypertension 10/22/2020  . Generalized anxiety disorder 10/22/2020  . Osteopenia determined by x-ray 10/22/2020  . Hyperlipemia, mixed 10/22/2020  . Encounter for counseling 09/04/2016    No Known Allergies  Past Surgical History:  Procedure Laterality Date  . FASCIOTOMY FOOT / TOE Left   . LAPAROSCOPIC APPENDECTOMY N/A 09/22/2017   Procedure: APPENDECTOMY LAPAROSCOPIC;  Surgeon: Earline Mayotte, MD;  Location: [facility] ORS;  Service: General;  Laterality: N/A;    Social History   Tobacco Use  . Smoking status: Never Smoker  . Smokeless tobacco: Never Used  Vaping Use  . Vaping Use: Never used  Substance Use Topics  . Alcohol use: Yes    Alcohol/week: 1.0 - 2.0 standard drink    Types: 1 Glasses of wine per week    Comment: 1 glass a month   . Drug use: No     Medication list has been reviewed and updated.  Current Meds  Medication Sig  .  CALCIUM PO Take 1,200 mg by mouth.  . conjugated estrogens (PREMARIN) vaginal cream INSERT 1/2 APPLICATORFUL VAGINALLY DAILY FOR TWO WEEKS, THEN SPACE IT OUT TO TWICE A WEEK THERAFTER.  Marland Kitchen escitalopram (LEXAPRO) 10 MG tablet TAKE 1 TABLET BY MOUTH DAILY.  Marland Kitchen olmesartan (BENICAR) 20 MG tablet TAKE 1 TABLET BY MOUTH DAILY.  Marland Kitchen PREMARIN vaginal cream Place vaginally.  Marland Kitchen VITAMIN D PO Take 2,000 Units by mouth daily.    PHQ 2/9 Scores 12/10/2020 10/22/2020  PHQ - 2 Score 0 0  PHQ- 9  Score 0 0    GAD 7 : Generalized Anxiety Score 12/10/2020 10/22/2020  Nervous, Anxious, on Edge 0 1  Control/stop worrying 0 1  Worry too much - different things 0 1  Trouble relaxing 0 3  Restless 0 0  Easily annoyed or irritable 0 2  Afraid - awful might happen 0 1  Total GAD 7 Score 0 9    BP Readings from Last 3 Encounters:  12/10/20 116/66  11/27/20 135/70  10/22/20 (!) 158/88    Physical Exam Vitals and nursing note reviewed.  Constitutional:      General: She is not in acute distress.    Appearance: Normal appearance. She is well-developed.  HENT:     Head: Normocephalic and atraumatic.  Cardiovascular:     Rate and Rhythm: Normal rate and regular rhythm.     Pulses: Normal pulses.     Heart sounds: No murmur heard.   Pulmonary:     Effort: Pulmonary effort is normal. No respiratory distress.     Breath sounds: No wheezing or rhonchi.  Musculoskeletal:     Cervical back: Normal range of motion.  Lymphadenopathy:     Cervical: No cervical adenopathy.  Skin:    General: Skin is warm and dry.     Capillary Refill: Capillary refill takes less than 2 seconds.     Findings: No rash.  Neurological:     General: No focal deficit present.     Mental Status: She is alert and oriented to person, place, and time.  Psychiatric:        Mood and Affect: Mood normal.        Behavior: Behavior normal.     Wt Readings from Last 3 Encounters:  12/10/20 156 lb (70.8 kg)  10/22/20 155 lb (70.3 kg)  10/01/17 153 lb (69.4 kg)    BP 116/66   Pulse 86   Temp 98 F (36.7 C) (Oral)   Ht 5\' 5"  (1.651 m)   Wt 156 lb (70.8 kg)   SpO2 100%   BMI 25.96 kg/m   Assessment and Plan: 1. Essential hypertension Clinically stable exam with well controlled BP on olmesartan. Tolerating medications without side effects at this time. Pt to continue current regimen and low sodium diet; benefits of regular exercise as able discussed. Follow up in 6 months  2. Generalized anxiety  disorder Much improved on Lexapro.   Tolerating well without side effects. Follow up in 6 months; sooner if needed   Partially dictated using Dragon software. Any errors are unintentional.  , MD [facility]  12/10/2020

## 2021-01-13 MED FILL — Olmesartan Medoxomil Tab 20 MG: ORAL | 90 days supply | Qty: 90 | Fill #0 | Status: AC

## 2021-01-13 MED FILL — Escitalopram Oxalate Tab 10 MG (Base Equiv): ORAL | 90 days supply | Qty: 90 | Fill #0 | Status: AC

## 2021-01-14 ENCOUNTER — Other Ambulatory Visit: Payer: Self-pay

## 2021-03-25 ENCOUNTER — Other Ambulatory Visit: Payer: Self-pay

## 2021-03-25 MED ORDER — SHINGRIX 50 MCG/0.5ML IM SUSR
0.5000 mL | Freq: Once | INTRAMUSCULAR | 0 refills | Status: AC
  Filled 2021-03-25 – 2021-04-16 (×2): qty 0.5, 1d supply, fill #0

## 2021-04-16 ENCOUNTER — Other Ambulatory Visit: Payer: Self-pay

## 2021-04-23 ENCOUNTER — Other Ambulatory Visit (HOSPITAL_COMMUNITY): Payer: Self-pay

## 2021-04-23 ENCOUNTER — Other Ambulatory Visit: Payer: Self-pay | Admitting: Internal Medicine

## 2021-04-23 DIAGNOSIS — F411 Generalized anxiety disorder: Secondary | ICD-10-CM

## 2021-04-23 DIAGNOSIS — I10 Essential (primary) hypertension: Secondary | ICD-10-CM

## 2021-04-23 MED ORDER — ESCITALOPRAM OXALATE 10 MG PO TABS
ORAL_TABLET | Freq: Every day | ORAL | 1 refills | Status: DC
  Filled 2021-04-23: qty 90, 90d supply, fill #0
  Filled 2021-07-26: qty 90, 90d supply, fill #1

## 2021-04-23 MED ORDER — OLMESARTAN MEDOXOMIL 20 MG PO TABS
ORAL_TABLET | Freq: Every day | ORAL | 1 refills | Status: DC
  Filled 2021-04-23: qty 90, 90d supply, fill #0
  Filled 2021-07-26: qty 90, 90d supply, fill #1

## 2021-04-24 ENCOUNTER — Other Ambulatory Visit (HOSPITAL_COMMUNITY): Payer: Self-pay

## 2021-05-10 ENCOUNTER — Other Ambulatory Visit (HOSPITAL_COMMUNITY): Payer: Self-pay

## 2021-05-28 ENCOUNTER — Ambulatory Visit: Payer: 59 | Admitting: Internal Medicine

## 2021-07-19 ENCOUNTER — Telehealth: Payer: 59 | Admitting: Family

## 2021-07-19 ENCOUNTER — Other Ambulatory Visit (HOSPITAL_COMMUNITY): Payer: Self-pay

## 2021-07-19 DIAGNOSIS — J069 Acute upper respiratory infection, unspecified: Secondary | ICD-10-CM | POA: Diagnosis not present

## 2021-07-19 MED ORDER — FLUTICASONE PROPIONATE 50 MCG/ACT NA SUSP
2.0000 | Freq: Every day | NASAL | 6 refills | Status: DC
Start: 2021-07-19 — End: 2021-10-24
  Filled 2021-07-19: qty 16, 30d supply, fill #0

## 2021-07-19 MED ORDER — BENZONATATE 100 MG PO CAPS
100.0000 mg | ORAL_CAPSULE | Freq: Three times a day (TID) | ORAL | 0 refills | Status: DC | PRN
  Filled 2021-07-19: qty 20, 7d supply, fill #0

## 2021-07-19 MED ORDER — PSEUDOEPH-BROMPHEN-DM 30-2-10 MG/5ML PO SYRP
5.0000 mL | ORAL_SOLUTION | Freq: Four times a day (QID) | ORAL | 0 refills | Status: DC | PRN
  Filled 2021-07-19: qty 120, 6d supply, fill #0

## 2021-07-19 NOTE — Progress Notes (Signed)

## 2021-07-19 NOTE — Addendum Note (Signed)
Addended by: Margaretann Loveless on: 07/19/2021 08:09 AM   Modules accepted: Orders

## 2021-07-23 ENCOUNTER — Encounter: Payer: Self-pay | Admitting: Plastic Surgery

## 2021-07-23 ENCOUNTER — Ambulatory Visit (INDEPENDENT_AMBULATORY_CARE_PROVIDER_SITE_OTHER): Payer: Self-pay | Admitting: Plastic Surgery

## 2021-07-23 ENCOUNTER — Other Ambulatory Visit: Payer: Self-pay

## 2021-07-23 DIAGNOSIS — Z719 Counseling, unspecified: Secondary | ICD-10-CM

## 2021-07-23 NOTE — Progress Notes (Signed)

## 2021-07-26 ENCOUNTER — Other Ambulatory Visit (HOSPITAL_COMMUNITY): Payer: Self-pay

## 2021-09-02 ENCOUNTER — Other Ambulatory Visit (HOSPITAL_COMMUNITY): Payer: Self-pay

## 2021-10-08 ENCOUNTER — Other Ambulatory Visit (HOSPITAL_COMMUNITY): Payer: Self-pay

## 2021-10-08 ENCOUNTER — Telehealth: Payer: 59 | Admitting: Physician Assistant

## 2021-10-08 DIAGNOSIS — J069 Acute upper respiratory infection, unspecified: Secondary | ICD-10-CM | POA: Diagnosis not present

## 2021-10-08 MED ORDER — IPRATROPIUM BROMIDE 0.03 % NA SOLN
2.0000 | Freq: Two times a day (BID) | NASAL | 0 refills | Status: DC
  Filled 2021-10-08: qty 30, 75d supply, fill #0

## 2021-10-08 MED ORDER — PSEUDOEPH-BROMPHEN-DM 30-2-10 MG/5ML PO SYRP
5.0000 mL | ORAL_SOLUTION | Freq: Four times a day (QID) | ORAL | 0 refills | Status: DC | PRN
  Filled 2021-10-08: qty 120, 6d supply, fill #0

## 2021-10-08 NOTE — Progress Notes (Signed)
E-Visit for Upper Respiratory Infection  ? ?We are sorry you are not feeling well.  Here is how we plan to help! ? ?Based on what you have shared with me, it looks like you may have a viral upper respiratory infection.  Upper respiratory infections are caused by a large number of viruses; however, rhinovirus is the most common cause.  ? ?Symptoms vary from person to person, with common symptoms including sore throat, cough, fatigue or lack of energy and feeling of general discomfort.  A low-grade fever of up to 100.4 may present, but is often uncommon.  Symptoms vary however, and are closely related to a person's age or underlying illnesses.  The most common symptoms associated with an upper respiratory infection are nasal discharge or congestion, cough, sneezing, headache and pressure in the ears and face.  These symptoms usually persist for about 3 to 10 days, but can last up to 2 weeks.  It is important to know that upper respiratory infections do not cause serious illness or complications in most cases.   ? ?Upper respiratory infections can be transmitted from person to person, with the most common method of transmission being a person's hands.  The virus is able to live on the skin and can infect other persons for up to 2 hours after direct contact.  Also, these can be transmitted when someone coughs or sneezes; thus, it is important to cover the mouth to reduce this risk.  To keep the spread of the illness at bay, good hand hygiene is very important. ? ?This is an infection that is most likely caused by a virus. There are no specific treatments other than to help you with the symptoms until the infection runs its course.  We are sorry you are not feeling well.  Here is how we plan to help! ? ? ?For nasal congestion, you may use an oral decongestants such as Mucinex D or if you have glaucoma or high blood pressure use plain Mucinex.  Saline nasal spray or nasal drops can help and can safely be used as often as  needed for congestion.  For your congestion, I have prescribed Ipratropium Bromide nasal spray 0.03% two sprays in each nostril 2-3 times a day ? ?If you do not have a history of heart disease, hypertension, diabetes or thyroid disease, prostate/bladder issues or glaucoma, you may also use Sudafed to treat nasal congestion.  It is highly recommended that you consult with a pharmacist or your primary care physician to ensure this medication is safe for you to take.    ? ?If you have a cough, you may use cough suppressants such as Delsym and Robitussin.  If you have glaucoma or high blood pressure, you can also use Coricidin HBP.   ?For cough I have prescribed for you  A prescription cough medication called and Bromfed DM cough syrup.  ? ?If you have a sore or scratchy throat, use a saltwater gargle- ? to ? teaspoon of salt dissolved in a 4-ounce to 8-ounce glass of warm water.  Gargle the solution for approximately 15-30 seconds and then spit.  It is important not to swallow the solution.  You can also use throat lozenges/cough drops and Chloraseptic spray to help with throat pain or discomfort.  Warm or cold liquids can also be helpful in relieving throat pain. ? ?For headache, pain or general discomfort, you can use Ibuprofen or Tylenol as directed.   ?Some authorities believe that zinc sprays or the use  of Echinacea may shorten the course of your symptoms. ? ? ?HOME CARE ?Only take medications as instructed by your medical team. ?Be sure to drink plenty of fluids. Water is fine as well as fruit juices, sodas and electrolyte beverages. You may want to stay away from caffeine or alcohol. If you are nauseated, try taking small sips of liquids. How do you know if you are getting enough fluid? Your urine should be a pale yellow or almost colorless. ?Get rest. ?Taking a steamy shower or using a humidifier may help nasal congestion and ease sore throat pain. You can place a towel over your head and breathe in the steam  from hot water coming from a faucet. ?Using a saline nasal spray works much the same way. ?Cough drops, hard candies and sore throat lozenges may ease your cough. ?Avoid close contacts especially the very young and the elderly ?Cover your mouth if you cough or sneeze ?Always remember to wash your hands.  ? ?GET HELP RIGHT AWAY IF: ?You develop worsening fever. ?If your symptoms do not improve within 10 days ?You develop yellow or green discharge from your nose over 3 days. ?You have coughing fits ?You develop a severe head ache or visual changes. ?You develop shortness of breath, difficulty breathing or start having chest pain ?Your symptoms persist after you have completed your treatment plan ? ?MAKE SURE YOU  ?Understand these instructions. ?Will watch your condition. ?Will get help right away if you are not doing well or get worse. ? ?Thank you for choosing an e-visit. ? ?Your e-visit answers were reviewed by a board certified advanced clinical practitioner to complete your personal care plan. Depending upon the condition, your plan could have included both over the counter or prescription medications. ? ?Please review your pharmacy choice. Make sure the pharmacy is open so you can pick up prescription now. If there is a problem, you may contact your provider through [employer] and have the prescription routed to another pharmacy.  Your safety is important to [place]. If you have drug allergies check your prescription carefully.  ? ?For the next 24 hours you can use MyChart to ask questions about today's visit, request a non-urgent call back, or ask for a work or school excuse. ?You will get an email in the next two days asking about your experience. I hope that your e-visit has been valuable and will speed your recovery. ? ? ?I provided 5 minutes of non face-to-face time during this encounter for chart review and documentation.  ? ?

## 2021-10-15 ENCOUNTER — Other Ambulatory Visit (HOSPITAL_COMMUNITY): Payer: Self-pay

## 2021-10-24 ENCOUNTER — Other Ambulatory Visit: Payer: Self-pay

## 2021-10-24 ENCOUNTER — Encounter: Payer: Self-pay | Admitting: Internal Medicine

## 2021-10-24 ENCOUNTER — Ambulatory Visit (INDEPENDENT_AMBULATORY_CARE_PROVIDER_SITE_OTHER): Payer: 59 | Admitting: Internal Medicine

## 2021-10-24 VITALS — BP 124/80 | HR 93 | Ht 65.0 in | Wt 163.4 lb

## 2021-10-24 DIAGNOSIS — M8580 Other specified disorders of bone density and structure, unspecified site: Secondary | ICD-10-CM | POA: Diagnosis not present

## 2021-10-24 DIAGNOSIS — Z1211 Encounter for screening for malignant neoplasm of colon: Secondary | ICD-10-CM | POA: Diagnosis not present

## 2021-10-24 DIAGNOSIS — I10 Essential (primary) hypertension: Secondary | ICD-10-CM

## 2021-10-24 DIAGNOSIS — F411 Generalized anxiety disorder: Secondary | ICD-10-CM

## 2021-10-24 DIAGNOSIS — Z0000 Encounter for general adult medical examination without abnormal findings: Secondary | ICD-10-CM | POA: Diagnosis not present

## 2021-10-24 DIAGNOSIS — E782 Mixed hyperlipidemia: Secondary | ICD-10-CM | POA: Diagnosis not present

## 2021-10-24 DIAGNOSIS — Z1231 Encounter for screening mammogram for malignant neoplasm of breast: Secondary | ICD-10-CM

## 2021-10-24 LAB — POCT URINALYSIS DIPSTICK
Bilirubin, UA: NEGATIVE
Blood, UA: NEGATIVE
Glucose, UA: NEGATIVE
Ketones, UA: NEGATIVE
Leukocytes, UA: NEGATIVE
Nitrite, UA: NEGATIVE
Protein, UA: NEGATIVE
Spec Grav, UA: 1.01 (ref 1.010–1.025)
Urobilinogen, UA: 0.2 E.U./dL
pH, UA: 7 (ref 5.0–8.0)

## 2021-10-24 MED ORDER — ESCITALOPRAM OXALATE 10 MG PO TABS
ORAL_TABLET | Freq: Every day | ORAL | 1 refills | Status: DC
  Filled 2021-10-24: qty 90, 90d supply, fill #0

## 2021-10-24 MED ORDER — OLMESARTAN MEDOXOMIL 20 MG PO TABS
ORAL_TABLET | Freq: Every day | ORAL | 1 refills | Status: DC
  Filled 2021-10-24: qty 90, 90d supply, fill #0

## 2021-10-24 NOTE — Progress Notes (Signed)
Date:  10/24/2021   Name:  Rachael Fischer   DOB:  13-Jul-1966   MRN:  [id number]   Chief Complaint: Annual Exam (Has GYN. No breast or pap.) Rachael Fischer is a 56 y.o. female who presents today for her Complete Annual Exam. She feels well. She reports exercising - not much. She reports she is sleeping well. Has appointment next month with her GYN doctor.  Mammogram: 08/2020 - scheduled for April 2023 DEXA: 09/2020 Pap smear: ASCUS neg HPV 08/2020 Dr. Norberto Sorenson [facility] Colonoscopy: none Shingrix: Declined to get 2nd dose due to side effects  Immunization History  Administered Date(s) Administered   Influenza-Unspecified 05/23/2016, 05/04/2020   PFIZER(Purple Top)SARS-COV-2 Vaccination 08/16/2019, 09/05/2019, 05/25/2020   Td 09/05/2011   Zoster Recombinat (Shingrix) 11/15/2020    Hypertension This is a chronic problem. The problem is controlled. Associated symptoms include anxiety. Pertinent negatives include no chest pain, headaches, palpitations or shortness of breath. Past treatments include angiotensin blockers. The current treatment provides significant improvement.  Anxiety Presents for follow-up visit. Patient reports no chest pain, dizziness, nervous/anxious behavior, palpitations or shortness of breath. Symptoms occur rarely. The quality of sleep is good.   Compliance with medications is 76-100% (lexapro).   Lab Results  Component Value Date   NA 141 10/22/2020   K 4.0 10/22/2020   CO2 24 10/22/2020   GLUCOSE 74 10/22/2020   BUN 18 10/22/2020   CREATININE 0.78 10/22/2020   CALCIUM 9.9 10/22/2020   EGFR 89 10/22/2020   GFRNONAA >60 09/22/2017   Lab Results  Component Value Date   CHOL 202 (H) 10/22/2020   HDL 61 10/22/2020   LDLCALC 127 (H) 10/22/2020   TRIG 76 10/22/2020   CHOLHDL 3.3 10/22/2020   Lab Results  Component Value Date   TSH 2.010 10/22/2020   Lab Results  Component Value Date   HGBA1C 5.5 09/04/2016   Lab Results  Component Value Date    WBC 8.0 10/22/2020   HGB 14.0 10/22/2020   HCT 41.1 10/22/2020   MCV 89 10/22/2020   PLT 354 10/22/2020   Lab Results  Component Value Date   ALT 22 10/22/2020   AST 27 10/22/2020   ALKPHOS 82 10/22/2020   BILITOT 0.5 10/22/2020   No results found for: 25OHVITD2, 25OHVITD3, VD25OH   Review of Systems  Constitutional:  Negative for chills, fatigue and fever.  HENT:  Negative for congestion, hearing loss, tinnitus, trouble swallowing and voice change.   Eyes:  Negative for visual disturbance.  Respiratory:  Negative for cough, chest tightness, shortness of breath and wheezing.   Cardiovascular:  Negative for chest pain, palpitations and leg swelling.  Gastrointestinal:  Negative for abdominal pain, constipation, diarrhea and vomiting.  Endocrine: Negative for polydipsia and polyuria.  Genitourinary:  Negative for dysuria, frequency, genital sores, vaginal bleeding and vaginal discharge.  Musculoskeletal:  Negative for arthralgias, gait problem and joint swelling.  Skin:  Negative for color change and rash.  Neurological:  Negative for dizziness, tremors, light-headedness and headaches.  Hematological:  Negative for adenopathy. Does not bruise/bleed easily.  Psychiatric/Behavioral:  Negative for dysphoric mood and sleep disturbance. The patient is not nervous/anxious.    Patient Active Problem List   Diagnosis Date Noted   Essential hypertension 10/22/2020   Generalized anxiety disorder 10/22/2020   Osteopenia determined by x-ray 10/22/2020   Hyperlipemia, mixed 10/22/2020   Encounter for counseling 09/04/2016    No Known Allergies  Past Surgical History:  Procedure Laterality Date  FASCIOTOMY FOOT / TOE Left    LAPAROSCOPIC APPENDECTOMY N/A 09/22/2017   Procedure: APPENDECTOMY LAPAROSCOPIC;  Surgeon: Earline Mayotte, MD;  Location: [facility] ORS;  Service: General;  Laterality: N/A;    Social History   Tobacco Use   Smoking status: Never   Smokeless tobacco: Never   Vaping Use   Vaping Use: Never used  Substance Use Topics   Alcohol use: Yes    Alcohol/week: 1.0 - 2.0 standard drink    Types: 1 Glasses of wine per week    Comment: 1 glass a month    Drug use: No     Medication list has been reviewed and updated.  Current Meds  Medication Sig   CALCIUM PO Take 1,200 mg by mouth.   escitalopram (LEXAPRO) 10 MG tablet TAKE 1 TABLET BY MOUTH DAILY.   olmesartan (BENICAR) 20 MG tablet TAKE 1 TABLET BY MOUTH DAILY.   PREMARIN vaginal cream Place vaginally.   VITAMIN D PO Take 2,000 Units by mouth daily.       10/24/2021    9:20 AM 12/10/2020    1:45 PM 10/22/2020    2:01 PM  PHQ 2/9 Scores  PHQ - 2 Score 0 0 0  PHQ- 9 Score 0 0 0       10/24/2021    9:20 AM 12/10/2020    1:45 PM 10/22/2020    2:02 PM  GAD 7 : Generalized Anxiety Score  Nervous, Anxious, on Edge 0 0 1  Control/stop worrying 0 0 1  Worry too much - different things 0 0 1  Trouble relaxing 0 0 3  Restless 0 0 0  Easily annoyed or irritable 0 0 2  Afraid - awful might happen 0 0 1  Total GAD 7 Score 0 0 9  Anxiety Difficulty Not difficult at all      BP Readings from Last 3 Encounters:  10/24/21 124/80  12/10/20 116/66  11/27/20 135/70    Physical Exam Vitals and nursing note reviewed.  Constitutional:      General: She is not in acute distress.    Appearance: She is well-developed.  HENT:     Head: Normocephalic and atraumatic.     Right Ear: Tympanic membrane and ear canal normal.     Left Ear: Tympanic membrane and ear canal normal.     Nose:     Right Sinus: No maxillary sinus tenderness.     Left Sinus: No maxillary sinus tenderness.  Eyes:     General: No scleral icterus.       Right eye: No discharge.        Left eye: No discharge.     Conjunctiva/sclera: Conjunctivae normal.  Neck:     Thyroid: No thyromegaly.     Vascular: No carotid bruit.  Cardiovascular:     Rate and Rhythm: Normal rate and regular rhythm.     Pulses: Normal pulses.      Heart sounds: Normal heart sounds.  Pulmonary:     Effort: Pulmonary effort is normal. No respiratory distress.     Breath sounds: No wheezing.  Abdominal:     General: Bowel sounds are normal.     Palpations: Abdomen is soft.     Tenderness: There is no abdominal tenderness.  Musculoskeletal:     Cervical back: Normal range of motion. No erythema.     Right lower leg: No edema.     Left lower leg: No edema.  Lymphadenopathy:  Cervical: No cervical adenopathy.  Skin:    General: Skin is warm and dry.     Findings: No rash.  Neurological:     Mental Status: She is alert and oriented to person, place, and time.     Cranial Nerves: No cranial nerve deficit.     Sensory: No sensory deficit.     Deep Tendon Reflexes: Reflexes are normal and symmetric.  Psychiatric:        Attention and Perception: Attention normal.        Mood and Affect: Mood normal.    Wt Readings from Last 3 Encounters:  10/24/21 163 lb 6.4 oz (74.1 kg)  12/10/20 156 lb (70.8 kg)  10/22/20 155 lb (70.3 kg)    BP 124/80   Pulse (!) 102   Ht 5\' 5"  (1.651 m)   Wt 163 lb 6.4 oz (74.1 kg)   SpO2 100%   BMI 27.19 kg/m   Assessment and Plan: 1. Annual physical exam Normal exam Continue healthy diet and exercise.  2. Encounter for screening mammogram for breast cancer To be done at GYN next month  3. Colon cancer screening She declines colonoscopy but will do Cologuard - Cologuard  4. Essential hypertension Clinically stable exam with well controlled BP. Tolerating medications without side effects at this time. Pt to continue current regimen and low sodium diet; benefits of regular exercise as able discussed. - CBC with Differential/Platelet - Comprehensive metabolic panel - POCT urinalysis dipstick - olmesartan (BENICAR) 20 MG tablet; TAKE 1 TABLET BY MOUTH DAILY.  Dispense: 90 tablet; Refill: 1  5. Generalized anxiety disorder Anxiety is much improved; tolerating Lexapro without SI/HI or  other side effects - TSH - escitalopram (LEXAPRO) 10 MG tablet; TAKE 1 TABLET BY MOUTH DAILY.  Dispense: 90 tablet; Refill: 1  6. Hyperlipemia, mixed Check labs and advise - Lipid panel  7. Osteopenia determined by x-ray Continue calcium and vitamin D - VITAMIN D 25 Hydroxy (Vit-D Deficiency, Fractures)   Partially dictated using Animal nutritionist. Any errors are unintentional.  Bari Edward, MD [facility]  10/24/2021

## 2021-10-25 ENCOUNTER — Encounter: Payer: 59 | Admitting: Plastic Surgery

## 2021-10-25 ENCOUNTER — Encounter: Payer: Self-pay | Admitting: Internal Medicine

## 2021-10-25 LAB — COMPREHENSIVE METABOLIC PANEL
ALT: 20 IU/L (ref 0–32)
AST: 20 IU/L (ref 0–40)
Albumin/Globulin Ratio: 1.6 (ref 1.2–2.2)
Albumin: 4.6 g/dL (ref 3.8–4.9)
Alkaline Phosphatase: 87 IU/L (ref 44–121)
BUN/Creatinine Ratio: 23 (ref 9–23)
BUN: 17 mg/dL (ref 6–24)
Bilirubin Total: 0.7 mg/dL (ref 0.0–1.2)
CO2: 26 mmol/L (ref 20–29)
Calcium: 9.9 mg/dL (ref 8.7–10.2)
Chloride: 100 mmol/L (ref 96–106)
Creatinine, Ser: 0.74 mg/dL (ref 0.57–1.00)
Globulin, Total: 2.9 g/dL (ref 1.5–4.5)
Glucose: 89 mg/dL (ref 70–99)
Potassium: 4.8 mmol/L (ref 3.5–5.2)
Sodium: 141 mmol/L (ref 134–144)
Total Protein: 7.5 g/dL (ref 6.0–8.5)
eGFR: 94 mL/min/{1.73_m2} (ref >59)

## 2021-10-25 LAB — CBC WITH DIFFERENTIAL/PLATELET
Basophils Absolute: 0.1 10*3/uL (ref 0.0–0.2)
Basos: 1 %
EOS (ABSOLUTE): 0.1 10*3/uL (ref 0.0–0.4)
Eos: 2 %
Hematocrit: 41.4 % (ref 34.0–46.6)
Hemoglobin: 14.2 g/dL (ref 11.1–15.9)
Immature Grans (Abs): 0 10*3/uL (ref 0.0–0.1)
Immature Granulocytes: 1 %
Lymphocytes Absolute: 1.8 10*3/uL (ref 0.7–3.1)
Lymphs: 27 %
MCH: 30.6 pg (ref 26.6–33.0)
MCHC: 34.3 g/dL (ref 31.5–35.7)
MCV: 89 fL (ref 79–97)
Monocytes Absolute: 0.7 10*3/uL (ref 0.1–0.9)
Monocytes: 11 %
Neutrophils Absolute: 3.9 10*3/uL (ref 1.4–7.0)
Neutrophils: 58 %
Platelets: 352 10*3/uL (ref 150–450)
RBC: 4.64 x10E6/uL (ref 3.77–5.28)
RDW: 12.2 % (ref 11.7–15.4)
WBC: 6.6 10*3/uL (ref 3.4–10.8)

## 2021-10-25 LAB — LIPID PANEL
Chol/HDL Ratio: 3.9 ratio (ref 0.0–4.4)
Cholesterol, Total: 201 mg/dL — ABNORMAL HIGH (ref 100–199)
HDL: 52 mg/dL (ref >39)
LDL Chol Calc (NIH): 135 mg/dL — ABNORMAL HIGH (ref 0–99)
Triglycerides: 75 mg/dL (ref 0–149)
VLDL Cholesterol Cal: 14 mg/dL (ref 5–40)

## 2021-10-25 LAB — VITAMIN D 25 HYDROXY (VIT D DEFICIENCY, FRACTURES): Vit D, 25-Hydroxy: 46.6 ng/mL (ref 30.0–100.0)

## 2021-10-25 LAB — TSH: TSH: 1.72 u[IU]/mL (ref 0.450–4.500)

## 2021-11-06 ENCOUNTER — Other Ambulatory Visit: Payer: Self-pay

## 2021-11-06 DIAGNOSIS — Z6828 Body mass index (BMI) 28.0-28.9, adult: Secondary | ICD-10-CM | POA: Diagnosis not present

## 2021-11-06 DIAGNOSIS — Z1231 Encounter for screening mammogram for malignant neoplasm of breast: Secondary | ICD-10-CM | POA: Diagnosis not present

## 2021-11-06 DIAGNOSIS — Z1151 Encounter for screening for human papillomavirus (HPV): Secondary | ICD-10-CM | POA: Diagnosis not present

## 2021-11-06 DIAGNOSIS — Z01419 Encounter for gynecological examination (general) (routine) without abnormal findings: Secondary | ICD-10-CM | POA: Diagnosis not present

## 2021-11-06 DIAGNOSIS — R87610 Atypical squamous cells of undetermined significance on cytologic smear of cervix (ASC-US): Secondary | ICD-10-CM | POA: Diagnosis not present

## 2021-11-06 MED ORDER — PREMARIN 0.625 MG/GM VA CREA
TOPICAL_CREAM | VAGINAL | 2 refills | Status: AC
  Filled 2021-11-06: qty 30, 90d supply, fill #0

## 2021-11-19 ENCOUNTER — Other Ambulatory Visit: Payer: Self-pay

## 2021-11-22 DIAGNOSIS — N841 Polyp of cervix uteri: Secondary | ICD-10-CM | POA: Diagnosis not present

## 2021-12-09 DIAGNOSIS — N819 Female genital prolapse, unspecified: Secondary | ICD-10-CM | POA: Diagnosis not present

## 2021-12-09 DIAGNOSIS — N841 Polyp of cervix uteri: Secondary | ICD-10-CM | POA: Diagnosis not present

## 2021-12-09 DIAGNOSIS — N8110 Cystocele, unspecified: Secondary | ICD-10-CM | POA: Diagnosis not present

## 2021-12-09 DIAGNOSIS — N952 Postmenopausal atrophic vaginitis: Secondary | ICD-10-CM | POA: Diagnosis not present

## 2021-12-09 DIAGNOSIS — R87610 Atypical squamous cells of undetermined significance on cytologic smear of cervix (ASC-US): Secondary | ICD-10-CM | POA: Diagnosis not present

## 2022-01-21 ENCOUNTER — Other Ambulatory Visit: Payer: Self-pay

## 2022-02-20 ENCOUNTER — Telehealth: Payer: Self-pay

## 2022-02-20 NOTE — Telephone Encounter (Signed)
Called pt as a reminder to complete Cologuard kit. Pt verbalized understanding.  KP

## 2022-04-25 ENCOUNTER — Ambulatory Visit: Payer: 59 | Admitting: Internal Medicine

## 2022-06-19 ENCOUNTER — Other Ambulatory Visit: Payer: Self-pay | Admitting: Internal Medicine

## 2022-06-19 DIAGNOSIS — I10 Essential (primary) hypertension: Secondary | ICD-10-CM

## 2022-06-19 DIAGNOSIS — F411 Generalized anxiety disorder: Secondary | ICD-10-CM

## 2022-06-19 NOTE — Telephone Encounter (Signed)
Patient has future OV scheduled, will refill medication.  Requested Prescriptions  Pending Prescriptions Disp Refills   olmesartan (BENICAR) 20 MG tablet [Pharmacy Med Name: olmesartan 20 mg tablet (BENICAR)] 90 tablet 1    Sig: TAKE 1 TABLET BY MOUTH DAILY     Cardiovascular:  Angiotensin Receptor Blockers Failed - 06/19/2022 10:10 AM      Failed - Cr in normal range and within 180 days    Creatinine, Ser  Date Value Ref Range Status  10/24/2021 0.74 0.57 - 1.00 mg/dL Final         Failed - K in normal range and within 180 days    Potassium  Date Value Ref Range Status  10/24/2021 4.8 3.5 - 5.2 mmol/L Final         Failed - Valid encounter within last 6 months    Recent Outpatient Visits           7 months ago Annual physical exam   [place] [facility], Nyoka Cowden, MD   1 year ago Essential hypertension   [place] [facility] at [facility], Nyoka Cowden, MD   1 year ago Essential hypertension   [place] [facility] at [facility], Nyoka Cowden, MD       Future Appointments             In 4 months Judithann Graves Nyoka Cowden, MD [facility] at [facility], [facility]            Passed - Patient is not pregnant      Passed - Last BP in normal range    BP Readings from Last 1 Encounters:  10/24/21 124/80          escitalopram (LEXAPRO) 10 MG tablet [Pharmacy Med Name: escitalopram 10 mg tablet (LEXAPRO)] 90 tablet 1    Sig: TAKE 1 TABLET BY MOUTH DAILY     Psychiatry:  Antidepressants - SSRI Failed - 06/19/2022 10:10 AM      Failed - Valid encounter within last 6 months    Recent Outpatient Visits           7 months ago Annual physical exam   [place] [facility] at [facility], Nyoka Cowden, MD   1 year ago Essential hypertension   [place] [facility] at  [facility], Nyoka Cowden, MD   1 year ago Essential hypertension   [place] [facility] at [facility], Nyoka Cowden, MD       Future Appointments             In 4 months Judithann Graves, Nyoka Cowden, MD [facility] at [facility], [facility]

## 2022-10-28 ENCOUNTER — Encounter: Payer: 59 | Admitting: Internal Medicine

## 2023-01-27 ENCOUNTER — Encounter: Payer: Self-pay | Admitting: Internal Medicine

## 2023-01-27 ENCOUNTER — Other Ambulatory Visit: Payer: Self-pay | Admitting: Internal Medicine

## 2023-01-27 DIAGNOSIS — I10 Essential (primary) hypertension: Secondary | ICD-10-CM

## 2023-01-27 DIAGNOSIS — F411 Generalized anxiety disorder: Secondary | ICD-10-CM

## 2023-01-27 MED ORDER — OLMESARTAN MEDOXOMIL 20 MG PO TABS: 20.0000 mg | ORAL_TABLET | Freq: Every day | ORAL | 0 refills | Status: AC

## 2023-01-27 MED ORDER — ESCITALOPRAM OXALATE 10 MG PO TABS
10.0000 mg | ORAL_TABLET | Freq: Every day | ORAL | 0 refills | Status: AC
Start: 2023-01-27 — End: 2024-01-27
